# Patient Record
Sex: Female | Born: 1970
Health system: Southern US, Community
[De-identification: ages and names within clinical notes are randomized; demographics above are authoritative.]

## PROBLEM LIST (undated history)

## (undated) DIAGNOSIS — I10 Essential (primary) hypertension: Secondary | ICD-10-CM

## (undated) DIAGNOSIS — M1990 Unspecified osteoarthritis, unspecified site: Secondary | ICD-10-CM

## (undated) DIAGNOSIS — Z8669 Personal history of other diseases of the nervous system and sense organs: Secondary | ICD-10-CM

## (undated) DIAGNOSIS — Z8041 Family history of malignant neoplasm of ovary: Secondary | ICD-10-CM

## (undated) HISTORY — PX: TUBAL LIGATION: SHX77

## (undated) HISTORY — PX: ENDOMETRIAL ABLATION: SHX621

## (undated) HISTORY — PX: BREAST BIOPSY: SHX20

## (undated) HISTORY — PX: FOOT SURGERY: SHX648

## (undated) HISTORY — DX: Personal history of other diseases of the nervous system and sense organs: Z86.69

## (undated) HISTORY — PX: LAPAROSCOPY: SHX197

---

## 2003-05-23 ENCOUNTER — Emergency Department (HOSPITAL_COMMUNITY): Admission: EM | Admit: 2003-05-23 | Discharge: 2003-05-23 | Payer: Self-pay | Admitting: Emergency Medicine

## 2004-06-01 ENCOUNTER — Emergency Department (HOSPITAL_COMMUNITY): Admission: EM | Admit: 2004-06-01 | Discharge: 2004-06-01 | Payer: Self-pay | Admitting: Emergency Medicine

## 2004-09-12 ENCOUNTER — Emergency Department (HOSPITAL_COMMUNITY): Admission: EM | Admit: 2004-09-12 | Discharge: 2004-09-12 | Payer: Self-pay | Admitting: Emergency Medicine

## 2005-01-02 ENCOUNTER — Other Ambulatory Visit: Admission: RE | Admit: 2005-01-02 | Discharge: 2005-01-02 | Payer: Self-pay | Admitting: Obstetrics and Gynecology

## 2006-09-21 ENCOUNTER — Ambulatory Visit: Payer: Self-pay | Admitting: Family Medicine

## 2007-11-18 HISTORY — PX: ENDOMETRIAL ABLATION: SHX621

## 2007-11-28 ENCOUNTER — Ambulatory Visit (HOSPITAL_COMMUNITY): Admission: RE | Admit: 2007-11-28 | Discharge: 2007-11-28 | Payer: Self-pay | Admitting: Obstetrics and Gynecology

## 2007-11-28 ENCOUNTER — Encounter (INDEPENDENT_AMBULATORY_CARE_PROVIDER_SITE_OTHER): Payer: Self-pay | Admitting: Obstetrics and Gynecology

## 2008-03-14 ENCOUNTER — Emergency Department (HOSPITAL_COMMUNITY): Admission: EM | Admit: 2008-03-14 | Discharge: 2008-03-14 | Payer: Self-pay | Admitting: Emergency Medicine

## 2008-04-23 ENCOUNTER — Ambulatory Visit: Payer: Self-pay | Admitting: Family Medicine

## 2008-09-16 ENCOUNTER — Emergency Department (HOSPITAL_COMMUNITY): Admission: EM | Admit: 2008-09-16 | Discharge: 2008-09-16 | Payer: Self-pay | Admitting: Emergency Medicine

## 2008-10-09 ENCOUNTER — Ambulatory Visit: Payer: Self-pay | Admitting: Family Medicine

## 2008-10-29 ENCOUNTER — Ambulatory Visit: Payer: Self-pay | Admitting: Family Medicine

## 2008-12-04 ENCOUNTER — Ambulatory Visit: Payer: Self-pay | Admitting: Family Medicine

## 2008-12-23 ENCOUNTER — Emergency Department (HOSPITAL_COMMUNITY): Admission: EM | Admit: 2008-12-23 | Discharge: 2008-12-23 | Payer: Self-pay | Admitting: Emergency Medicine

## 2009-06-23 ENCOUNTER — Ambulatory Visit: Payer: Self-pay | Admitting: Family Medicine

## 2010-07-09 ENCOUNTER — Encounter: Payer: Self-pay | Admitting: Emergency Medicine

## 2010-09-25 LAB — CBC
HCT: 40.7 % (ref 36.0–46.0)
Hemoglobin: 13.8 g/dL (ref 12.0–15.0)
MCHC: 33.9 g/dL (ref 30.0–36.0)
MCV: 97.8 fL (ref 78.0–100.0)
Platelets: 286 10*3/uL (ref 150–400)
RBC: 4.16 MIL/uL (ref 3.87–5.11)
RDW: 13.2 % (ref 11.5–15.5)
WBC: 7.3 10*3/uL (ref 4.0–10.5)

## 2010-09-25 LAB — BASIC METABOLIC PANEL
BUN: 8 mg/dL (ref 6–23)
CO2: 28 mEq/L (ref 19–32)
Calcium: 9 mg/dL (ref 8.4–10.5)
Chloride: 109 mEq/L (ref 96–112)
Creatinine, Ser: 0.67 mg/dL (ref 0.4–1.2)
GFR calc Af Amer: >60 mL/min (ref >60)
GFR calc non Af Amer: >60 mL/min (ref >60)
Glucose, Bld: 66 mg/dL — ABNORMAL LOW (ref 70–99)
Potassium: 3.5 mEq/L (ref 3.5–5.1)
Sodium: 141 mEq/L (ref 135–145)

## 2010-09-25 LAB — URINALYSIS, ROUTINE W REFLEX MICROSCOPIC
Bilirubin Urine: NEGATIVE
Glucose, UA: NEGATIVE mg/dL
Hgb urine dipstick: NEGATIVE
Ketones, ur: NEGATIVE mg/dL
Nitrite: NEGATIVE
Protein, ur: NEGATIVE mg/dL
Specific Gravity, Urine: 1.023 (ref 1.005–1.030)
Urobilinogen, UA: 1 mg/dL (ref 0.0–1.0)
pH: 6.5 (ref 5.0–8.0)

## 2010-09-25 LAB — DIFFERENTIAL
Basophils Absolute: 0 10*3/uL (ref 0.0–0.1)
Basophils Relative: 1 % (ref 0–1)
Eosinophils Absolute: 0.1 10*3/uL (ref 0.0–0.7)
Eosinophils Relative: 1 % (ref 0–5)
Lymphocytes Relative: 36 % (ref 12–46)
Lymphs Abs: 2.6 10*3/uL (ref 0.7–4.0)
Monocytes Absolute: 0.4 10*3/uL (ref 0.1–1.0)
Monocytes Relative: 6 % (ref 3–12)
Neutro Abs: 4.1 10*3/uL (ref 1.7–7.7)
Neutrophils Relative %: 57 % (ref 43–77)

## 2010-09-25 LAB — POCT PREGNANCY, URINE: Preg Test, Ur: NEGATIVE

## 2010-09-29 LAB — BASIC METABOLIC PANEL
BUN: 7 mg/dL (ref 6–23)
CO2: 24 mEq/L (ref 19–32)
Calcium: 8.7 mg/dL (ref 8.4–10.5)
Chloride: 101 mEq/L (ref 96–112)
Creatinine, Ser: 0.75 mg/dL (ref 0.4–1.2)
GFR calc Af Amer: >60 mL/min (ref >60)
GFR calc non Af Amer: >60 mL/min (ref >60)
Glucose, Bld: 86 mg/dL (ref 70–99)
Potassium: 4.6 mEq/L (ref 3.5–5.1)
Sodium: 132 mEq/L — ABNORMAL LOW (ref 135–145)

## 2010-11-01 NOTE — H&P (Signed)
NAMESAORY, CARRIERO         ACCOUNT NO.:  [id number]   MEDICAL RECORD NO.:  [id number]          PATIENT TYPE:  AMB   LOCATION:  SDC                           FACILITY:  [facility]   PHYSICIAN:  Zenaida Niece, M.D.DATE OF BIRTH:  04-Jun-1971   DATE OF ADMISSION:  DATE OF DISCHARGE:                              HISTORY & PHYSICAL   CHIEF COMPLAINT:  Pelvic pain and irregular menses.   HISTORY OF PRESENT ILLNESS:  This is a 40 year old female para 4-0-2-4,  who saw our nurse practitioner in April for her annual exam.  At that  time, she was complaining of pelvic pain and dysmenorrhea as well as  dyspareunia and recent irregular periods.  She had a pelvic ultrasound  performed in the middle of May which revealed a couple of small fibroids  and normal ovaries although the left ovary is close to the uterus and  this is consistent with a prior ultrasound also with that ovary close to  the uterus.  The patient says her pain is with and between periods and  with intercourse and is gradually getting worse and she wishes to  proceed with laparoscopic evaluation.  Also due to her recent irregular  periods, she wants to proceed with hysteroscopy and endometrial ablation  and is admitted for this at this time.   PAST OBSTETRICAL HISTORY:  Four vaginal deliveries at term and two  elective terminations.   PAST MEDICAL HISTORY:  Negative.   PAST SURGICAL HISTORY:  1. Laparoscopy.  2. Tubal ligation.   ALLERGIES:  None.   CURRENT MEDICATIONS:  None.   SOCIAL HISTORY:  She is married and does smoke six cigarettes a day.   FAMILY HISTORY:  No GYN or colon cancer.   REVIEW OF SYSTEMS:  Normal bowel and bladder function.   PAST GYNECOLOGICAL HISTORY:  No history of sexually transmitted diseases  or abnormal Pap smears.   PHYSICAL EXAMINATION:  GENERAL APPEARANCE:  This is a well-developed  female in no acute distress.  Weight is 144 pounds.  NECK:  Supple without lymphadenopathy or  thyromegaly.  LUNGS:  Clear to auscultation.  HEART:  Regular rate and rhythm without murmur.  ABDOMEN:  Soft, nontender, nondistended without palpable masses.  She  does have scars from prior laparoscopy.  PELVIC:  Exam reveals normal external genitalia.  Speculum reveals a  normal cervix and on bimanual exam she has a small mid planar to  retroverted uterus that is slightly tender.  She has no adnexal masses  and is nontender in the adnexa.  EXTREMITIES:  No edema and are nontender.   ASSESSMENT:  Pelvic pain with dysmenorrhea and dyspareunia.  She also  has recent irregular menses.  All nonsurgical and surgical options have  been discussed with the patient.  The patient wishes to proceed with  laparoscopic evaluation for her pain and wishes to proceed with  hysteroscopy and endometrial ablation for her irregular periods.  All  risks of surgery have been discussed and the patient understands.   PLAN:  Admit the patient on the day of surgery for diagnostic  laparoscopy with possible fulguration of endometriosis or adhesiolysis.  We will then do a hysteroscopy and NovaSure endometrial ablation.      Zenaida Niece, M.D.  Electronically Signed     TDM/MEDQ  D:  11/27/2007  T:  11/27/2007  Job:  [id number]

## 2010-11-01 NOTE — Op Note (Signed)
NAMEKENYON, Emily Baker         ACCOUNT NO.:  [id number]   MEDICAL RECORD NO.:  [id number]          PATIENT TYPE:  AMB   LOCATION:  SDC                           FACILITY:  [facility]   PHYSICIAN:  Zenaida Niece, M.D.DATE OF BIRTH:  24-Aug-1970   DATE OF PROCEDURE:  11/28/2007  DATE OF DISCHARGE:                               OPERATIVE REPORT   PREOPERATIVE DIAGNOSES:  1. Pelvic pain.  2. Dyspareunia.  3. Abnormal uterine bleeding.   POSTOPERATIVE DIAGNOSES:  1. Pelvic pain.  2. Dyspareunia.  3. Abnormal uterine bleeding.  4. Possible endometriosis.  5. Upper abdominal adhesions.   PROCEDURES:  1. Laparoscopy with fulguration of possible endometriosis.  2. Hysteroscopy with dilation and curettage.  3. NovaSure endometrial ablation.   SURGEON:  Zenaida Niece, MD   ANESTHESIA:  General endotracheal tube and paracervical block.   FINDINGS:  Through the laparoscope, she had a normal uterus with a  probable small posterior myoma.  There was a clip on the left fallopian  tube and the right fallopian tube was separated, but no clip was  identified.  She had a couple spots of possible endometriosis around the  right fallopian tube and at the right uterosacral ligament very close to  the right uterine artery.  There were adhesions of the ascending colon  to the right abdominal wall and adhesions of the liver to the abdominal  wall.  With hysteroscopy, she had a normal uterine cavity.  NovaSure  device used a uterine depth of 4.5 cm, uterine width of 4.1 cm, and used  101 watts for approximately 90 seconds.   SPECIMENS:  Endometrial curettings sent to Pathology.   ESTIMATED BLOOD LOSS:  Minimal.   COMPLICATIONS:  None.   PROCEDURE IN DETAIL:  The patient was taken to the operating room and  placed in the dorsal supine position.  General anesthesia was induced,  and legs were placed in mobile stirrups.  Abdomen, perineum and vagina  were then prepped and draped in the usual  sterile fashion.  The bladder  was drained with a latex-free catheter, and a Hulka tenaculum was  applied to the cervix for uterine manipulation.  Infraumbilical skin was  then infiltrated with 0.25% Marcaine and a 0.75-cm horizontal incision  was made in her previous scar.  The Veress needle was inserted into the  peritoneal cavity and placement confirmed by the water drop test and an  opening pressure of 4 mmHg.  CO2 gas was then insufflated to a pressure  of 12 mmHg, and the Veress needle was removed.  A 5-mm laparoscope was  then introduced with direct visualization with a blunt 5-mm trocar.  This achieved good insertion.  A 5-mm port was then placed also on the  left side under direct visualization.  Inspection revealed the above-  mentioned findings.  Adhesions of the ascending colon to the abdominal  wall were taken down bluntly.  The adhesions of the liver on both sides  to the anterior abdominal wall were left alone.  Inspection of pelvis  revealed essentially normal anatomy.  Again, the right tube did not have  a clip on  it, but did appear to be separated by a good 1-2 cm.  There  were a couple of spots around the right fallopian tube that appeared to  be consistent with endometriosis, and these were fulgurated with bipolar  cautery.  There was another spot very close to the right uterine artery  and this was left alone.  No other source for her pelvic pain was  identified.  The 5-mm trocar on the left side was removed under direct  visualization.  All gas was allowed to deflate from the abdomen.  The  umbilical trocar was then removed.  Skin incisions were closed with  interrupted subcuticular sutures of 4-0 Vicryl followed by Dermabond.   Attention was turned to hysteroscopy.  The legs were elevated in  stirrups.  A Graves speculum was inserted into the vagina and the  anterior lip of the cervix was grasped with a single-tooth tenaculum.  The patient was given Toradol IV.   Paracervical block was performed with  a total of 16 mL of 2% plain lidocaine.  This was a deep block to help  aid with the endometrial ablation.  Uterus then sounded to 8 cm.  Cervix  was gradually, easily dilated eventually to accommodate a size 7 Hegar  dilator.  This measured the cervix at 3.5 cm.  The observer hysteroscope  was inserted and good visualization was achieved.  The uterine cavity  appeared normal with a fair amount of endometrial tissue.  The observer  hysteroscope was removed.  Sharp curettage was then performed with  return of minimal tissue.  Hysteroscopy was again performed and revealed  again a normal cavity with less endometrial tissue.  The hysteroscope  was then removed.  The cervix was further dilated to accommodate the  size 8 Hegar dilator.  The NovaSure device was then inserted and  deployed appropriately.  CO2 test was passed, and endometrial ablation  was performed with the above-mentioned settings without complications.  The device was then removed intact.  Hysteroscopy again was performed  and revealed a normal cavity, which appeared to have good ablation.  Hysteroscope was removed.  The single-tooth tenaculum was removed and  all bleeding was controlled with pressure.  All instruments were then  removed from the vagina.  The patient was then taken down from stirrups.  She was extubated in the operating room and taken to the recovery room  in stable condition after tolerating the procedure well.  Counts were  correct x2 and she had PAS hose on throughout the procedure.      Zenaida Niece, M.D.  Electronically Signed     TDM/MEDQ  D:  11/28/2007  T:  11/28/2007  Job:  [id number]

## 2011-01-17 ENCOUNTER — Ambulatory Visit (INDEPENDENT_AMBULATORY_CARE_PROVIDER_SITE_OTHER): Payer: BC Managed Care – PPO | Admitting: Family Medicine

## 2011-01-17 ENCOUNTER — Encounter: Payer: Self-pay | Admitting: Family Medicine

## 2011-01-17 VITALS — BP 146/90 | HR 84 | Wt 137.0 lb

## 2011-01-17 DIAGNOSIS — R519 Headache, unspecified: Secondary | ICD-10-CM

## 2011-01-17 DIAGNOSIS — R51 Headache: Secondary | ICD-10-CM

## 2011-01-17 NOTE — Progress Notes (Signed)
  Subjective:    Patient ID: Emily Baker, female    DOB: Oct 28, 1970, 40 y.o.   MRN: [id number]  HPI She is here for evaluation of headaches. She has a history of migraine headaches however she also mentions having chronic daily headaches at least for the last year. She placed on Effexor in 2010 and stopped but does not rule it helped the headaches. For her daily headaches she takes 2 Advil 5 or 6 times per day. The headache is constant and pressure-like. No sneezing, itchy watery eyes, blurred vision, double vision, nausea. She is on no medicine. She does smoke   Review of Systems     Objective:   Physical Exam alert and in no distress. EOMI. Tympanic membranes and canals are normal. Throat is clear. Tonsils are normal. Neck is supple without adenopathy or thyromegaly. Cardiac exam shows a regular sinus rhythm without murmurs or gallops. Lungs are clear to auscultation.        Assessment & Plan:  Chronic daily headaches for medication overuse Refer to headache clinic.

## 2011-03-16 LAB — CBC
HCT: 41.3
Hemoglobin: 14.2
MCHC: 34.4
MCV: 96.2
Platelets: 173
RBC: 4.29
RDW: 12.7
WBC: 5.9

## 2011-03-16 LAB — PREGNANCY, URINE: Preg Test, Ur: NEGATIVE

## 2012-01-17 ENCOUNTER — Emergency Department (HOSPITAL_BASED_OUTPATIENT_CLINIC_OR_DEPARTMENT_OTHER)
Admission: EM | Admit: 2012-01-17 | Discharge: 2012-01-17 | Disposition: A | Discharge status: Home / Self Care | Payer: Self-pay | Attending: Emergency Medicine | Admitting: Emergency Medicine

## 2012-01-17 ENCOUNTER — Encounter (HOSPITAL_BASED_OUTPATIENT_CLINIC_OR_DEPARTMENT_OTHER): Payer: Self-pay

## 2012-01-17 DIAGNOSIS — F17200 Nicotine dependence, unspecified, uncomplicated: Secondary | ICD-10-CM | POA: Insufficient documentation

## 2012-01-17 DIAGNOSIS — H109 Unspecified conjunctivitis: Secondary | ICD-10-CM | POA: Insufficient documentation

## 2012-01-17 MED ORDER — TOBRAMYCIN 0.3 % OP SOLN: 1.0000 [drp] | OPHTHALMIC | Status: DC

## 2012-01-17 NOTE — ED Notes (Signed)
Redness, drainage and pain to left eye that started Monday.  Recent exposure to pink eye.

## 2012-01-17 NOTE — ED Provider Notes (Signed)
History     CSN: [id number]  Arrival date & time 01/17/12  1118   First MD Initiated Contact with Patient 01/17/12 1158      Chief Complaint  Patient presents with  . Conjunctivitis    (Consider location/radiation/quality/duration/timing/severity/associated sxs/prior treatment) Patient is a 41 y.o. female presenting with conjunctivitis. The history is provided by the patient. No language interpreter was used.  Conjunctivitis  The current episode started today. The problem occurs occasionally. The problem has been gradually worsening. The problem is moderate. Nothing relieves the symptoms. Nothing aggravates the symptoms. Associated symptoms include photophobia and eye redness. The eye pain is moderate. There is pain in the left eye. The eye pain is not associated with movement. There were sick contacts at home.  Pt reports exposure to child with pink eye  History reviewed. No pertinent past medical history.  Past Surgical History  Procedure Date  . Endometrial ablation   . Tubal ligation     No family history on file.  History  Substance Use Topics  . Smoking status: Current Everyday Smoker -- 0.5 packs/day  . Smokeless tobacco: Never Used  . Alcohol Use: Yes     occasional    OB History    Grav Para Term Preterm Abortions TAB SAB Ect Mult Living                  Review of Systems  Eyes: Positive for photophobia and redness.  All other systems reviewed and are negative.    Allergies  Review of patient's allergies indicates no known allergies.  Home Medications  No current outpatient prescriptions on file.  BP 140/93  Pulse 84  Temp 98 F (36.7 C) (Oral)  Resp 20  Ht 5\' 3"  (1.6 m)  Wt 137 lb (62.143 kg)  BMI 24.27 kg/m2  SpO2 100%  Physical Exam  Nursing note and vitals reviewed. Constitutional: She is oriented to person, place, and time. She appears well-developed and well-nourished.  HENT:  Head: Normocephalic and atraumatic.  Right Ear:  External ear normal.  Left Ear: External ear normal.  Nose: Nose normal.  Mouth/Throat: Oropharynx is clear and moist.  Eyes: Right eye exhibits discharge.  Neck: Normal range of motion. Neck supple.  Pulmonary/Chest: Effort normal and breath sounds normal.  Abdominal: Soft.  Musculoskeletal: Normal range of motion.  Neurological: She is alert and oriented to person, place, and time. She has normal reflexes.  Skin: Skin is warm.  Psychiatric: She has a normal mood and affect.    ED Course  Procedures (including critical care time)  Labs Reviewed - No data to display No results found.   No diagnosis found.    MDM  tobrex Baldemar Lenis, [place] 01/17/12 1208

## 2012-01-17 NOTE — ED Notes (Signed)
Visual acuity completed.  Patient able to ambulate with standby assistance to the Eye chart; gait steady.

## 2012-01-17 NOTE — ED Provider Notes (Signed)
Medical screening examination/treatment/procedure(s) were performed by non-physician practitioner and as supervising physician I was immediately available for consultation/collaboration.   Luisantonio Adinolfi, MD 01/17/12 1538 

## 2012-01-21 ENCOUNTER — Encounter (HOSPITAL_BASED_OUTPATIENT_CLINIC_OR_DEPARTMENT_OTHER): Payer: Self-pay | Admitting: Emergency Medicine

## 2012-01-21 ENCOUNTER — Emergency Department (HOSPITAL_BASED_OUTPATIENT_CLINIC_OR_DEPARTMENT_OTHER)
Admission: EM | Admit: 2012-01-21 | Discharge: 2012-01-21 | Disposition: A | Discharge status: Home / Self Care | Payer: Self-pay | Attending: Emergency Medicine | Admitting: Emergency Medicine

## 2012-01-21 DIAGNOSIS — H109 Unspecified conjunctivitis: Secondary | ICD-10-CM | POA: Insufficient documentation

## 2012-01-21 DIAGNOSIS — F17200 Nicotine dependence, unspecified, uncomplicated: Secondary | ICD-10-CM | POA: Insufficient documentation

## 2012-01-21 MED ORDER — SULFACETAMIDE SODIUM 10 % OP SOLN: 1.0000 [drp] | OPHTHALMIC | Status: DC

## 2012-01-21 NOTE — ED Notes (Signed)
Pt stated that she started having left eye pain on Sunday 7/28. The next day it turned red and pt came to [facility] center and was diagnosed with conjunctivitis. She stated that it still hurts but the pain is not as bad. She stated that its not watering like it was and the eye cont to be red.

## 2012-01-21 NOTE — ED Provider Notes (Signed)
History     CSN: [id number]  Arrival date & time 01/21/12  1035   First MD Initiated Contact with Patient 01/21/12 1103      Chief Complaint  Patient presents with  . Eye Pain  . Conjunctivitis    (Consider location/radiation/quality/duration/timing/severity/associated sxs/prior treatment) HPI Comments: Was seen here for conjunctivitis one week ago and given drops and has not improved.  Patient is a 41 y.o. female presenting with eye pain. The history is provided by the patient.  Eye Pain This is a new problem. Episode onset: one week ago. The problem occurs constantly. The problem has not changed since onset.Nothing aggravates the symptoms. Nothing relieves the symptoms.    History reviewed. No pertinent past medical history.  Past Surgical History  Procedure Date  . Endometrial ablation   . Tubal ligation     No family history on file.  History  Substance Use Topics  . Smoking status: Current Everyday Smoker -- 0.5 packs/day  . Smokeless tobacco: Never Used  . Alcohol Use: Yes     occasional    OB History    Grav Para Term Preterm Abortions TAB SAB Ect Mult Living                  Review of Systems  All other systems reviewed and are negative.    Allergies  Review of patient's allergies indicates no known allergies.  Home Medications   Current Outpatient Rx  Name Route Sig Dispense Refill  . TOBRAMYCIN SULFATE 0.3 % OP SOLN Left Eye Place 1 drop into the left eye every 4 (four) hours. 5 mL 0    BP 131/78  Pulse 63  Temp 98.7 F (37.1 C) (Oral)  Resp 18  SpO2 100%  Physical Exam  Nursing note and vitals reviewed. Constitutional: She is oriented to person, place, and time. She appears well-developed and well-nourished. No distress.  HENT:  Head: Normocephalic and atraumatic.  Eyes:       The left conjunctiva is injected with clear drainage.  There are no corneal abrasions or foreign bodies noted.    Neck: Normal range of motion. Neck supple.    Neurological: She is alert and oriented to person, place, and time.  Skin: Skin is warm and dry. She is not diaphoretic.    ED Course  Procedures (including critical care time)  Labs Reviewed - No data to display No results found.   No diagnosis found.    MDM  Looks like conjunctivitis that is not responding to tobramycin.  Will change from tobramycin to bleph 10.  Return prn if not improving.          Geoffery Lyons, MD 01/21/12 [phone]

## 2012-01-24 ENCOUNTER — Emergency Department (HOSPITAL_BASED_OUTPATIENT_CLINIC_OR_DEPARTMENT_OTHER)
Admission: EM | Admit: 2012-01-24 | Discharge: 2012-01-24 | Disposition: A | Discharge status: Home / Self Care | Payer: BC Managed Care – PPO | Attending: Emergency Medicine | Admitting: Emergency Medicine

## 2012-01-24 ENCOUNTER — Encounter (HOSPITAL_BASED_OUTPATIENT_CLINIC_OR_DEPARTMENT_OTHER): Payer: Self-pay | Admitting: Emergency Medicine

## 2012-01-24 DIAGNOSIS — H5710 Ocular pain, unspecified eye: Secondary | ICD-10-CM | POA: Insufficient documentation

## 2012-01-24 DIAGNOSIS — H5789 Other specified disorders of eye and adnexa: Secondary | ICD-10-CM | POA: Insufficient documentation

## 2012-01-24 DIAGNOSIS — F17200 Nicotine dependence, unspecified, uncomplicated: Secondary | ICD-10-CM | POA: Insufficient documentation

## 2012-01-24 DIAGNOSIS — H5712 Ocular pain, left eye: Secondary | ICD-10-CM

## 2012-01-24 MED ORDER — FLUORESCEIN SODIUM 1 MG OP STRP
ORAL_STRIP | OPHTHALMIC | Status: AC
  Administered 2012-01-24: 22:00:00
  Filled 2012-01-24: qty 1

## 2012-01-24 MED ORDER — TRAMADOL HCL 50 MG PO TABS: 50.0000 mg | ORAL_TABLET | Freq: Four times a day (QID) | ORAL | Status: AC | PRN

## 2012-01-24 MED ORDER — TETRACAINE HCL 0.5 % OP SOLN
OPHTHALMIC | Status: AC
  Administered 2012-01-24: 1 [drp]
  Filled 2012-01-24: qty 2

## 2012-01-24 MED ORDER — IBUPROFEN 800 MG PO TABS
800.0000 mg | ORAL_TABLET | Freq: Once | ORAL | Status: AC
  Administered 2012-01-24: 800 mg via ORAL
  Filled 2012-01-24: qty 1

## 2012-01-24 MED ORDER — OLOPATADINE HCL 0.1 % OP SOLN
1.0000 [drp] | Freq: Two times a day (BID) | OPHTHALMIC | Status: DC
  Filled 2012-01-24: qty 5

## 2012-01-24 MED ORDER — OLOPATADINE HCL 0.1 % OP SOLN: 1.0000 [drp] | Freq: Two times a day (BID) | OPHTHALMIC | Status: AC

## 2012-01-24 NOTE — ED Notes (Signed)
Pt having redness and drainage to left eye x 1 week. Pt was seen here last wed and sun for same with no improvement.

## 2012-01-24 NOTE — ED Provider Notes (Signed)
History     CSN: [id number]  Arrival date & time 01/24/12  1932   First MD Initiated Contact with Patient 01/24/12 2105      Chief Complaint  Patient presents with  . Conjunctivitis    (Consider location/radiation/quality/duration/timing/severity/associated sxs/prior treatment) HPI  41 year old female in no acute distress complaining of persistence of left eye redness and pain over the course of the last 7 days. Patient has been seen twice for similar in the last 7 days. She was given Tobramycin x7  Days ago, switched to erythromycin ointment x2 days and switched to sulfacetamide  yesterday.  Steady, non-worsening eye pain and vision blurred. Ibuprofen relieves pain. Pain is 8/10 throbbing, with associated headache. Pian is exacerbated by eye movement,  Pt is photosensitive. Denies purulent discharge.  History reviewed. No pertinent past medical history.  Past Surgical History  Procedure Date  . Endometrial ablation   . Tubal ligation     No family history on file.  History  Substance Use Topics  . Smoking status: Current Everyday Smoker -- 0.5 packs/day  . Smokeless tobacco: Never Used  . Alcohol Use: Yes     occasional    OB History    Grav Para Term Preterm Abortions TAB SAB Ect Mult Living                  Review of Systems  Eyes: Positive for photophobia, pain and redness.  All other systems reviewed and are negative.    Allergies  Review of patient's allergies indicates no known allergies.  Home Medications   Current Outpatient Rx  Name Route Sig Dispense Refill  . SULFACETAMIDE SODIUM 10 % OP SOLN Left Eye Place 1-2 drops into the left eye every 4 (four) hours. 5 mL 0  . TOBRAMYCIN SULFATE 0.3 % OP SOLN Left Eye Place 1 drop into the left eye every 4 (four) hours. 5 mL 0    BP 138/94  Pulse 86  Temp 98.7 F (37.1 C) (Oral)  Resp 18  SpO2 100%  Physical Exam  Vitals reviewed. Constitutional: She is oriented to person, place, and time. She  appears well-developed and well-nourished. No distress.  HENT:  Head: Normocephalic.  Eyes: Conjunctivae and EOM are normal. Pupils are equal, round, and reactive to light.       Left eye has severely injected palpebral and bulbar conjunctiva. Pupils are equal and reactive to light bilaterally. The left eye is watering with no purulent discharge. There is no swelling to the eyelids and no proptosis.  Cardiovascular: Normal rate.   Pulmonary/Chest: Effort normal.  Musculoskeletal: Normal range of motion.  Neurological: She is alert and oriented to person, place, and time.  Psychiatric: She has a normal mood and affect.    ED Course  Procedures (including critical care time)  Labs Reviewed - No data to display No results found.   1. Redness of eye, left   2. Pain, eye, left       MDM  41 year old female with injected painful left eye for 7 days. Patient has been using antibiotic eyedrops for the last 7 days with no improvement in condition.  Fluorescein stain reveals no corneal abrasion. Intraocular pressures are normal as measured by the tonometer at 18 mmHg.   We'll give the patient no drops and tramadol for pain control and advised her to follow within the next 24 hours with ophthalmologist Dr. Allyne Gee.  Discussed case with attending who agrees with plan and stability to d/c to home.  Pt verbalized understanding and agrees with care plan. Outpatient follow-up and return precautions given.            Wynetta Emery, PA-C 01/24/12 2222

## 2012-01-25 NOTE — ED Provider Notes (Signed)
Medical screening examination/treatment/procedure(s) were performed by non-physician practitioner and as supervising physician I was immediately available for consultation/collaboration.    Kevina Piloto D Ramzey Petrovic, MD 01/25/12 0309 

## 2012-11-17 LAB — HM PAP SMEAR: HM Pap smear: NORMAL

## 2012-11-17 LAB — HM MAMMOGRAPHY: HM Mammogram: NORMAL

## 2013-08-21 ENCOUNTER — Emergency Department (HOSPITAL_BASED_OUTPATIENT_CLINIC_OR_DEPARTMENT_OTHER)
Admission: EM | Admit: 2013-08-21 | Discharge: 2013-08-21 | Disposition: A | Discharge status: Home / Self Care | Payer: No Typology Code available for payment source | Attending: Emergency Medicine | Admitting: Emergency Medicine

## 2013-08-21 ENCOUNTER — Encounter (HOSPITAL_BASED_OUTPATIENT_CLINIC_OR_DEPARTMENT_OTHER): Payer: Self-pay | Admitting: Emergency Medicine

## 2013-08-21 DIAGNOSIS — R519 Headache, unspecified: Secondary | ICD-10-CM

## 2013-08-21 DIAGNOSIS — R079 Chest pain, unspecified: Secondary | ICD-10-CM

## 2013-08-21 DIAGNOSIS — E876 Hypokalemia: Secondary | ICD-10-CM | POA: Insufficient documentation

## 2013-08-21 DIAGNOSIS — R0789 Other chest pain: Secondary | ICD-10-CM | POA: Insufficient documentation

## 2013-08-21 DIAGNOSIS — R51 Headache: Secondary | ICD-10-CM | POA: Insufficient documentation

## 2013-08-21 DIAGNOSIS — F17200 Nicotine dependence, unspecified, uncomplicated: Secondary | ICD-10-CM | POA: Insufficient documentation

## 2013-08-21 LAB — BASIC METABOLIC PANEL
BUN: 6 mg/dL (ref 6–23)
CO2: 21 mEq/L (ref 19–32)
Calcium: 7.5 mg/dL — ABNORMAL LOW (ref 8.4–10.5)
Chloride: 108 mEq/L (ref 96–112)
Creatinine, Ser: 0.6 mg/dL (ref 0.50–1.10)
GFR calc Af Amer: >90 mL/min (ref >90)
GFR calc non Af Amer: >90 mL/min (ref >90)
Glucose, Bld: 88 mg/dL (ref 70–99)
Potassium: 2.7 mEq/L — CL (ref 3.7–5.3)
Sodium: 142 mEq/L (ref 137–147)

## 2013-08-21 LAB — CBC
HCT: 38.4 % (ref 36.0–46.0)
Hemoglobin: 13.4 g/dL (ref 12.0–15.0)
MCH: 32.2 pg (ref 26.0–34.0)
MCHC: 34.9 g/dL (ref 30.0–36.0)
MCV: 92.3 fL (ref 78.0–100.0)
Platelets: 244 10*3/uL (ref 150–400)
RBC: 4.16 MIL/uL (ref 3.87–5.11)
RDW: 12.2 % (ref 11.5–15.5)
WBC: 7.6 10*3/uL (ref 4.0–10.5)

## 2013-08-21 LAB — TROPONIN I: Troponin I: <0.3 ng/mL (ref <0.30)

## 2013-08-21 MED ORDER — METOCLOPRAMIDE HCL 5 MG/ML IJ SOLN
10.0000 mg | Freq: Once | INTRAMUSCULAR | Status: AC
  Administered 2013-08-21: 10 mg via INTRAVENOUS
  Filled 2013-08-21: qty 2

## 2013-08-21 MED ORDER — POTASSIUM CHLORIDE CRYS ER 20 MEQ PO TBCR
60.0000 meq | EXTENDED_RELEASE_TABLET | Freq: Once | ORAL | Status: AC
  Administered 2013-08-21: 60 meq via ORAL
  Filled 2013-08-21: qty 3

## 2013-08-21 MED ORDER — KETOROLAC TROMETHAMINE 30 MG/ML IJ SOLN
30.0000 mg | Freq: Once | INTRAMUSCULAR | Status: AC
  Administered 2013-08-21: 30 mg via INTRAVENOUS
  Filled 2013-08-21: qty 2

## 2013-08-21 MED ORDER — DIPHENHYDRAMINE HCL 50 MG/ML IJ SOLN
25.0000 mg | Freq: Once | INTRAMUSCULAR | Status: AC
  Administered 2013-08-21: 25 mg via INTRAVENOUS
  Filled 2013-08-21: qty 1

## 2013-08-21 MED ORDER — DEXAMETHASONE SODIUM PHOSPHATE 10 MG/ML IJ SOLN
10.0000 mg | Freq: Once | INTRAMUSCULAR | Status: AC
  Administered 2013-08-21: 10 mg via INTRAVENOUS
  Filled 2013-08-21: qty 1

## 2013-08-21 MED ORDER — SODIUM CHLORIDE 0.9 % IV BOLUS (SEPSIS)
1000.0000 mL | Freq: Once | INTRAVENOUS | Status: AC
  Administered 2013-08-21: 1000 mL via INTRAVENOUS

## 2013-08-21 NOTE — Discharge Instructions (Signed)
As discussed, your evaluation today has been largely reassuring beyond the low potassium level.  But, it is important that you monitor your condition carefully, and do not hesitate to return to the ED if you develop new, or concerning changes in your condition.  Otherwise, please follow-up with your physician for appropriate ongoing care.

## 2013-08-21 NOTE — ED Provider Notes (Signed)
CSN: [id number]     Arrival date & time 08/21/13  1340 History   First MD Initiated Contact with Patient 08/21/13 1344     Chief Complaint  Patient presents with  . Headache  . Chest Pain      HPI  Patient presents with concerns headache, chest pain. Patient has a history of migraine in the distant past, though no headaches recently.  She notes that after a stressful family event this weekend she developed her headache. She gradually developed headache 4 days ago.  Since onset the headache is become more present, more painful.  Headache is right sided, nonradiating, throbbing, sore.  There is continuous pain in the right head and right neck. Pain is improved with aspirin or ibuprofen. Today the patient developed anterior chest pressure, nonradiating, sore, sternal and epigastric.  No associated dyspnea, lightheadedness, syncope, unilateral weakness. As the headache persisted the patient also developed right-sided neck pain, though no visual loss. No nausea, vomiting, diarrhea, disequilibrium, discoordination.   History reviewed. No pertinent past medical history. Past Surgical History  Procedure Laterality Date  . Endometrial ablation    . Tubal ligation    . Foot surgery     No family history on file. History  Substance Use Topics  . Smoking status: Current Every Day Smoker -- 0.50 packs/day  . Smokeless tobacco: Never Used  . Alcohol Use: Yes     Comment: occasional   OB History   Grav Para Term Preterm Abortions TAB SAB Ect Mult Living                 Review of Systems  Constitutional:       Per HPI, otherwise negative  HENT:       Per HPI, otherwise negative  Respiratory:       Per HPI, otherwise negative  Cardiovascular:       Per HPI, otherwise negative  Gastrointestinal: Negative for vomiting.  Endocrine:       Negative aside from HPI  Genitourinary:       Neg aside from HPI   Musculoskeletal:       Per HPI, otherwise negative  Skin: Negative.    Neurological: Positive for headaches. Negative for syncope.      Allergies  Review of patient's allergies indicates no known allergies.  Home Medications   Current Outpatient Rx  Name  Route  Sig  Dispense  Refill  . ibuprofen (ADVIL,MOTRIN) 200 MG tablet   Oral   Take 400 mg by mouth every 6 (six) hours as needed. For pain.         Marland Kitchen PRESCRIPTION MEDICATION   Ophthalmic   Apply 1 application to eye daily. Erythromycin ophthalmic ointment 3.5 %.          BP 162/88  Pulse 88  Temp(Src) 98.9 F (37.2 C) (Oral)  Resp 18  SpO2 100% Physical Exam  Nursing note and vitals reviewed. Constitutional: She is oriented to person, place, and time. She appears well-developed and well-nourished. No distress.  HENT:  Head: Normocephalic and atraumatic.  Eyes: Conjunctivae and EOM are normal.  Neck: Normal range of motion. Neck supple. No rigidity. No edema, no erythema and normal range of motion present.  Cardiovascular: Normal rate and regular rhythm.   Pulmonary/Chest: Effort normal and breath sounds normal. No stridor. No respiratory distress.  Abdominal: She exhibits no distension.  Musculoskeletal: She exhibits no edema.  Neurological: She is alert and oriented to person, place, and time. She displays no  atrophy and no tremor. No cranial nerve deficit or sensory deficit. She exhibits normal muscle tone. She displays no seizure activity. Coordination and gait normal.  Patient ambulatory to bathroom and back with no antalgic gait  Skin: Skin is warm and dry.  Psychiatric: She has a normal mood and affect.    ED Course  Procedures (including critical care time) Labs Review Labs Reviewed  CBC  TROPONIN I  BASIC METABOLIC PANEL   Imaging Review No results found.   EKG Interpretation   Date/Time:  Thursday August 21 2013 13:53:07 EST Ventricular Rate:  87 PR Interval:  142 QRS Duration: 78 QT Interval:  382 QTC Calculation: 459 R Axis:   69 Text Interpretation:   Normal sinus rhythm Normal ECG Sinus rhythm Normal  ECG Confirmed by Carmin Muskrat  MD ([id number]) on 08/21/2013 2:26:33 PM     3:17 PM Patient in no distress.  She appears well.  HA improved. MDM  Patient presents with concern of ongoing headache, no chest pain.  On exam she is awake, alert, hemodynamically stable, neurologically intact, in no distress.  With patient's history of migraines in the past, there is some suspicion for this entity.  Patient improved here with fluids, analgesics.  Patient was hungry.  Patient's chest pain may be due to anti-inflammatory use.  Patient's EKG, labs are reassuring, and she has no risk factors for ACS.    Carmin Muskrat, MD 08/21/13 [phone]

## 2013-08-21 NOTE — ED Notes (Signed)
Pt c/o HA since Monday with sudden onset; Pt also c/o chest tightness that started about 11am today; Pt has been taking Goodies and ibuprofen for headache with no relief

## 2013-11-18 DIAGNOSIS — G43909 Migraine, unspecified, not intractable, without status migrainosus: Secondary | ICD-10-CM | POA: Insufficient documentation

## 2013-11-18 DIAGNOSIS — I10 Essential (primary) hypertension: Secondary | ICD-10-CM | POA: Insufficient documentation

## 2014-05-20 ENCOUNTER — Emergency Department (HOSPITAL_BASED_OUTPATIENT_CLINIC_OR_DEPARTMENT_OTHER)
Admission: EM | Admit: 2014-05-20 | Discharge: 2014-05-20 | Disposition: A | Discharge status: Home / Self Care | Payer: No Typology Code available for payment source | Attending: Emergency Medicine | Admitting: Emergency Medicine

## 2014-05-20 ENCOUNTER — Encounter (HOSPITAL_BASED_OUTPATIENT_CLINIC_OR_DEPARTMENT_OTHER): Payer: Self-pay

## 2014-05-20 DIAGNOSIS — I10 Essential (primary) hypertension: Secondary | ICD-10-CM | POA: Insufficient documentation

## 2014-05-20 DIAGNOSIS — Z79899 Other long term (current) drug therapy: Secondary | ICD-10-CM | POA: Insufficient documentation

## 2014-05-20 DIAGNOSIS — Z720 Tobacco use: Secondary | ICD-10-CM | POA: Insufficient documentation

## 2014-05-20 DIAGNOSIS — R0602 Shortness of breath: Secondary | ICD-10-CM | POA: Insufficient documentation

## 2014-05-20 DIAGNOSIS — Z3202 Encounter for pregnancy test, result negative: Secondary | ICD-10-CM | POA: Insufficient documentation

## 2014-05-20 DIAGNOSIS — I160 Hypertensive urgency: Secondary | ICD-10-CM

## 2014-05-20 HISTORY — DX: Essential (primary) hypertension: I10

## 2014-05-20 LAB — CBC WITH DIFFERENTIAL/PLATELET
Basophils Absolute: 0 10*3/uL (ref 0.0–0.1)
Basophils Relative: 0 % (ref 0–1)
Eosinophils Absolute: 0 10*3/uL (ref 0.0–0.7)
Eosinophils Relative: 0 % (ref 0–5)
HCT: 41.5 % (ref 36.0–46.0)
Hemoglobin: 14.3 g/dL (ref 12.0–15.0)
Lymphocytes Relative: 32 % (ref 12–46)
Lymphs Abs: 2.7 10*3/uL (ref 0.7–4.0)
MCH: 32.2 pg (ref 26.0–34.0)
MCHC: 34.5 g/dL (ref 30.0–36.0)
MCV: 93.5 fL (ref 78.0–100.0)
Monocytes Absolute: 0.7 10*3/uL (ref 0.1–1.0)
Monocytes Relative: 8 % (ref 3–12)
Neutro Abs: 5 10*3/uL (ref 1.7–7.7)
Neutrophils Relative %: 60 % (ref 43–77)
Platelets: 245 10*3/uL (ref 150–400)
RBC: 4.44 MIL/uL (ref 3.87–5.11)
RDW: 13.4 % (ref 11.5–15.5)
WBC: 8.4 10*3/uL (ref 4.0–10.5)

## 2014-05-20 LAB — COMPREHENSIVE METABOLIC PANEL
ALT: 15 U/L (ref 0–35)
AST: 21 U/L (ref 0–37)
Albumin: 4.2 g/dL (ref 3.5–5.2)
Alkaline Phosphatase: 48 U/L (ref 39–117)
Anion gap: 14 (ref 5–15)
BUN: 6 mg/dL (ref 6–23)
CO2: 25 mEq/L (ref 19–32)
Calcium: 9.7 mg/dL (ref 8.4–10.5)
Chloride: 101 mEq/L (ref 96–112)
Creatinine, Ser: 0.6 mg/dL (ref 0.50–1.10)
GFR calc Af Amer: >90 mL/min (ref >90)
GFR calc non Af Amer: >90 mL/min (ref >90)
Glucose, Bld: 82 mg/dL (ref 70–99)
Potassium: 3.8 mEq/L (ref 3.7–5.3)
Sodium: 140 mEq/L (ref 137–147)
Total Bilirubin: 0.5 mg/dL (ref 0.3–1.2)
Total Protein: 7.8 g/dL (ref 6.0–8.3)

## 2014-05-20 LAB — URINALYSIS, ROUTINE W REFLEX MICROSCOPIC
Bilirubin Urine: NEGATIVE
Glucose, UA: NEGATIVE mg/dL
Hgb urine dipstick: NEGATIVE
Ketones, ur: NEGATIVE mg/dL
Leukocytes, UA: NEGATIVE
Nitrite: NEGATIVE
Protein, ur: NEGATIVE mg/dL
Specific Gravity, Urine: 1.013 (ref 1.005–1.030)
Urobilinogen, UA: 0.2 mg/dL (ref 0.0–1.0)
pH: 5.5 (ref 5.0–8.0)

## 2014-05-20 LAB — TROPONIN I: Troponin I: <0.3 ng/mL (ref <0.30)

## 2014-05-20 LAB — PREGNANCY, URINE: Preg Test, Ur: NEGATIVE

## 2014-05-20 MED ORDER — AMLODIPINE BESYLATE 5 MG PO TABS: 5.0000 mg | ORAL_TABLET | Freq: Every day | ORAL | Status: DC

## 2014-05-20 MED ORDER — ACETAMINOPHEN 500 MG PO TABS
1000.0000 mg | ORAL_TABLET | Freq: Once | ORAL | Status: AC
  Administered 2014-05-20: 1000 mg via ORAL
  Filled 2014-05-20: qty 2

## 2014-05-20 NOTE — Discharge Instructions (Signed)
Norvasc as prescribed. Continue your ramipril as prescribed.  Keep a record your blood pressures until your follow-up appointment with your primary doctor. This should be within the next week.   Hypertension Hypertension, commonly called high blood pressure, is when the force of blood pumping through your arteries is too strong. Your arteries are the blood vessels that carry blood from your heart throughout your body. A blood pressure reading consists of a higher number over a lower number, such as 110/72. The higher number (systolic) is the pressure inside your arteries when your heart pumps. The lower number (diastolic) is the pressure inside your arteries when your heart relaxes. Ideally you want your blood pressure below 120/80. Hypertension forces your heart to work harder to pump blood. Your arteries may become narrow or stiff. Having hypertension puts you at risk for heart disease, stroke, and other problems.  RISK FACTORS Some risk factors for high blood pressure are controllable. Others are not.  Risk factors you cannot control include:   Race. You may be at higher risk if you are African American.  Age. Risk increases with age.  Gender. Men are at higher risk than women before age 55 years. After age 109, women are at higher risk than men. Risk factors you can control include:  Not getting enough exercise or physical activity.  Being overweight.  Getting too much fat, sugar, calories, or salt in your diet.  Drinking too much alcohol. SIGNS AND SYMPTOMS Hypertension does not usually cause signs or symptoms. Extremely high blood pressure (hypertensive crisis) may cause headache, anxiety, shortness of breath, and nosebleed. DIAGNOSIS  To check if you have hypertension, your health care provider will measure your blood pressure while you are seated, with your arm held at the level of your heart. It should be measured at least twice using the same arm. Certain conditions can cause a  difference in blood pressure between your right and left arms. A blood pressure reading that is higher than normal on one occasion does not mean that you need treatment. If one blood pressure reading is high, ask your health care provider about having it checked again. TREATMENT  Treating high blood pressure includes making lifestyle changes and possibly taking medicine. Living a healthy lifestyle can help lower high blood pressure. You may need to change some of your habits. Lifestyle changes may include:  Following the DASH diet. This diet is high in fruits, vegetables, and whole grains. It is low in salt, red meat, and added sugars.  Getting at least 2 hours of brisk physical activity every week.  Losing weight if necessary.  Not smoking.  Limiting alcoholic beverages.  Learning ways to reduce stress. If lifestyle changes are not enough to get your blood pressure under control, your health care provider may prescribe medicine. You may need to take more than one. Work closely with your health care provider to understand the risks and benefits. HOME CARE INSTRUCTIONS  Have your blood pressure rechecked as directed by your health care provider.   Take medicines only as directed by your health care provider. Follow the directions carefully. Blood pressure medicines must be taken as prescribed. The medicine does not work as well when you skip doses. Skipping doses also puts you at risk for problems.   Do not smoke.   Monitor your blood pressure at home as directed by your health care provider. SEEK MEDICAL CARE IF:   You think you are having a reaction to medicines taken.  You have  recurrent headaches or feel dizzy.  You have swelling in your ankles.  You have trouble with your vision. SEEK IMMEDIATE MEDICAL CARE IF:  You develop a severe headache or confusion.  You have unusual weakness, numbness, or feel faint.  You have severe chest or abdominal pain.  You vomit  repeatedly.  You have trouble breathing. MAKE SURE YOU:   Understand these instructions.  Will watch your condition.  Will get help right away if you are not doing well or get worse. Document Released: 06/05/2005 Document Revised: 10/20/2013 Document Reviewed: 03/28/2013 [facility] Patient Information 2015 [place], [place]. This information is not intended to replace advice given to you by your health care provider. Make sure you discuss any questions you have with your health care provider.

## 2014-05-20 NOTE — ED Provider Notes (Signed)
CSN: [id number]     Arrival date & time 05/20/14  1404 History   First MD Initiated Contact with Patient 05/20/14 1552     Chief Complaint  Patient presents with  . Hypertension     (Consider location/radiation/quality/duration/timing/severity/associated sxs/prior Treatment) HPI Comments: Patient is a 43 year old female with history of hypertension. She presents today with complaints of elevated blood pressure, worse over the past several days. Today while she was at work she developed lightheadedness and shortness of breath. She then checked her blood pressure and it was 160/100. This is much I are than she has ever measured and presents for evaluation of this. She denies to me she is experiencing any chest pain. She denies any other recent illness. She tells her she has been on multiple antihypertensives since March, however none of these have adequately controlled her blood pressure. She is currently taking Ramipril, the dose of which was recently increased from 5-10 mg.  Patient is a 43 y.o. female presenting with hypertension. The history is provided by the patient.  Hypertension This is a chronic problem. The problem occurs constantly. The problem has been rapidly worsening. Associated symptoms include shortness of breath. Pertinent negatives include no chest pain. Nothing aggravates the symptoms. Nothing relieves the symptoms. She has tried nothing for the symptoms. The treatment provided no relief.    Past Medical History  Diagnosis Date  . Hypertension    Past Surgical History  Procedure Laterality Date  . Endometrial ablation    . Tubal ligation    . Foot surgery     No family history on file. History  Substance Use Topics  . Smoking status: Current Every Day Smoker -- 0.50 packs/day  . Smokeless tobacco: Never Used  . Alcohol Use: Yes     Comment: occasional   OB History    No data available     Review of Systems  Respiratory: Positive for shortness of breath.    Cardiovascular: Negative for chest pain.  All other systems reviewed and are negative.     Allergies  Review of patient's allergies indicates no known allergies.  Home Medications   Prior to Admission medications   Medication Sig Start Date End Date Taking? Authorizing Provider  ramipril (ALTACE) 10 MG capsule Take 10 mg by mouth daily.   Yes Historical Provider, MD   BP 143/89 mmHg  Pulse 82  Temp(Src) 99.1 F (37.3 C) (Oral)  Resp 14  Ht 5\' 3"  (1.6 m)  Wt 140 lb (63.504 kg)  BMI 24.81 kg/m2  SpO2 100% Physical Exam  Constitutional: She is oriented to person, place, and time. She appears well-developed and well-nourished. No distress.  HENT:  Head: Normocephalic and atraumatic.  Mouth/Throat: Oropharynx is clear and moist.  Eyes: EOM are normal. Pupils are equal, round, and reactive to light.  Neck: Normal range of motion. Neck supple.  Cardiovascular: Normal rate and regular rhythm.  Exam reveals no gallop and no friction rub.   No murmur heard. Pulmonary/Chest: Effort normal and breath sounds normal. No respiratory distress. She has no wheezes.  Abdominal: Soft. Bowel sounds are normal. She exhibits no distension. There is no tenderness.  Musculoskeletal: Normal range of motion.  Lymphadenopathy:    She has no cervical adenopathy.  Neurological: She is alert and oriented to person, place, and time. No cranial nerve deficit. She exhibits normal muscle tone. Coordination normal.  Skin: Skin is warm and dry. She is not diaphoretic.  Nursing note and vitals reviewed.   ED  Course  Procedures (including critical care time) Labs Review Labs Reviewed  COMPREHENSIVE METABOLIC PANEL  CBC WITH DIFFERENTIAL  TROPONIN I    Imaging Review No results found.   EKG Interpretation   Date/Time:  Wednesday May 20 2014 16:33:18 EST Ventricular Rate:  86 PR Interval:  142 QRS Duration: 82 QT Interval:  354 QTC Calculation: 423 R Axis:   73 Text Interpretation:   Normal sinus rhythm Normal ECG Confirmed by DELOS   MD, Terese Heier ([id number]) on 05/20/2014 11:36:20 PM      MDM   Final diagnoses:  None    Patient presents with complaints of elevated blood pressure and dizziness. She has been on multiple medications since March of this year. She is mildly hypertensive while in the emergency department, however states her blood pressure is much higher prior to coming here. Neurologically intact and workup reveals no evidence of any emergent process. Her EKG and troponin are unchanged and blood counts and electrolytes are normal. She is now feeling better. She will be discharged to home with a prescription for Norvasc that she is to take in addition to her ramipril. She is to keep an eye on her blood pressures and follow-up with her primary Dr. in the next week to discuss the results.    Veryl Speak, MD 05/20/14 [phone]

## 2014-05-20 NOTE — ED Notes (Signed)
C/o elevated BP x 3 days 138/94, 160/99-hx HTN since  march 2015 with several med changes- pt became light headed at work today

## 2014-11-03 ENCOUNTER — Telehealth: Payer: Self-pay | Admitting: *Deleted

## 2014-11-03 NOTE — Telephone Encounter (Signed)
Unable to reach patient at time of Pre-Visit Call.  Left message for patient to return call when available.    

## 2014-11-04 ENCOUNTER — Telehealth: Payer: Self-pay | Admitting: Physician Assistant

## 2014-11-04 ENCOUNTER — Ambulatory Visit (INDEPENDENT_AMBULATORY_CARE_PROVIDER_SITE_OTHER): Payer: No Typology Code available for payment source | Admitting: Physician Assistant

## 2014-11-04 ENCOUNTER — Telehealth: Payer: Self-pay | Admitting: Family Medicine

## 2014-11-04 ENCOUNTER — Encounter: Payer: Self-pay | Admitting: Physician Assistant

## 2014-11-04 VITALS — BP 130/80 | HR 88 | Temp 99.0°F | Ht 63.5 in | Wt 145.8 lb

## 2014-11-04 DIAGNOSIS — R7989 Other specified abnormal findings of blood chemistry: Secondary | ICD-10-CM

## 2014-11-04 DIAGNOSIS — I10 Essential (primary) hypertension: Secondary | ICD-10-CM

## 2014-11-04 DIAGNOSIS — Z720 Tobacco use: Secondary | ICD-10-CM

## 2014-11-04 DIAGNOSIS — R5382 Chronic fatigue, unspecified: Secondary | ICD-10-CM

## 2014-11-04 DIAGNOSIS — G9332 Myalgic encephalomyelitis/chronic fatigue syndrome: Secondary | ICD-10-CM | POA: Insufficient documentation

## 2014-11-04 LAB — TSH: TSH: 0.64 u[IU]/mL (ref 0.35–4.50)

## 2014-11-04 LAB — COMPREHENSIVE METABOLIC PANEL
ALT: 21 U/L (ref 0–35)
AST: 20 U/L (ref 0–37)
Albumin: 4.6 g/dL (ref 3.5–5.2)
Alkaline Phosphatase: 42 U/L (ref 39–117)
BUN: 9 mg/dL (ref 6–23)
CO2: 26 mEq/L (ref 19–32)
Calcium: 9.6 mg/dL (ref 8.4–10.5)
Chloride: 102 mEq/L (ref 96–112)
Creatinine, Ser: 0.78 mg/dL (ref 0.40–1.20)
GFR: 103.5 mL/min (ref >60.00)
Glucose, Bld: 91 mg/dL (ref 70–99)
Potassium: 3.6 mEq/L (ref 3.5–5.1)
Sodium: 134 mEq/L — ABNORMAL LOW (ref 135–145)
Total Bilirubin: 0.7 mg/dL (ref 0.2–1.2)
Total Protein: 7.4 g/dL (ref 6.0–8.3)

## 2014-11-04 LAB — CBC
HCT: 42.2 % (ref 36.0–46.0)
Hemoglobin: 14.3 g/dL (ref 12.0–15.0)
MCHC: 33.8 g/dL (ref 30.0–36.0)
MCV: 92.8 fl (ref 78.0–100.0)
Platelets: 283 10*3/uL (ref 150.0–400.0)
RBC: 4.55 Mil/uL (ref 3.87–5.11)
RDW: 13.3 % (ref 11.5–15.5)
WBC: 8.7 10*3/uL (ref 4.0–10.5)

## 2014-11-04 LAB — VITAMIN D 25 HYDROXY (VIT D DEFICIENCY, FRACTURES): VITD: 9.26 ng/mL — ABNORMAL LOW (ref 30.00–100.00)

## 2014-11-04 LAB — C-REACTIVE PROTEIN: CRP: <0.1 mg/dL — ABNORMAL LOW (ref 0.5–20.0)

## 2014-11-04 LAB — T4, FREE: Free T4: 0.72 ng/dL (ref 0.60–1.60)

## 2014-11-04 MED ORDER — RAMIPRIL 10 MG PO CAPS: 10.0000 mg | ORAL_CAPSULE | Freq: Every day | ORAL | Status: DC

## 2014-11-04 MED ORDER — AMLODIPINE BESYLATE 5 MG PO TABS: 5.0000 mg | ORAL_TABLET | Freq: Every day | ORAL | Status: DC

## 2014-11-04 MED ORDER — VITAMIN D (ERGOCALCIFEROL) 1.25 MG (50000 UNIT) PO CAPS: 50000.0000 [IU] | ORAL_CAPSULE | ORAL | Status: DC

## 2014-11-04 NOTE — Patient Instructions (Addendum)
Please go to the lab for blood work. I will call you with your results. Please continue BP medications as directed. Have Dr. Jerrye Bushy office send over your records to our office -- [phone] (fax).  Follow-up will be based on your lab results.

## 2014-11-04 NOTE — Telephone Encounter (Signed)
New pt to [facility] seeing Elyn Aquas, [place]. Pt filled out medical records release form but it is not signed. Placed in file at front desk and left msg for pt to drop by and sign form so we can request medical records from [facility] Dr. Jesse Fall.

## 2014-11-04 NOTE — Progress Notes (Signed)
Pre visit review using our clinic review tool, if applicable. No additional management support is needed unless otherwise documented below in the visit note. 

## 2014-11-04 NOTE — Telephone Encounter (Signed)
Labs look good overall.  Vitamin D level very low which could be culprit of fatigue. I have sent in Rx for Ergocalciferol 50,000 units once weekly for 10 weeks.  Follow-up 10 weeks for reassessment and repeat labs.

## 2014-11-04 NOTE — Assessment & Plan Note (Signed)
Unclear etiology.  Exam within normal limits. Will obtain lab workup to include CBC, CMP, TSH, T4, CRP and Vitamin D level.

## 2014-11-04 NOTE — Assessment & Plan Note (Signed)
Controlled.  Asymptomatic.  Medications refilled.  DASH diet encouraged. Smoking cessation encouraged.

## 2014-11-04 NOTE — Assessment & Plan Note (Signed)
Patient is attempting smoking cessation efforts on her own currently.  She is to return if she wishes to discuss pharmacotherapy.

## 2014-11-04 NOTE — Telephone Encounter (Signed)
Notified patient of labs and Rx and she stated understanding.  Future vitamin d lab ordered, lab appointment scheduled for 01/15/15.

## 2014-11-04 NOTE — Progress Notes (Signed)
Patient presents to clinic today to establish care.  Acute Concerns: Patient endorses chronic fatigue over the past year associated with mild myalgias. Denies history of anemia or thyroid disorder. Denies vitamin deficiency.  Patient endorses poor diet overall.  Chronic Issues: Hypertension -- Currently on amlodipine and ramipril daily.  Endorses previously well-controlled on current regimen.  Patient denies chest pain, palpitations, lightheadedness, dizziness, vision changes or frequent headaches.  Recent EST unremarkable.  Migraine Headaches --  Endorses migraine headaches averaging once per week.  Endorses photophobia and nausea with headaches.  Has been on multiple abortive therapies but states she is very sensitive to medications and they make her sleepy.    Health Maintenance: Dental -- up-to-date Vision -- up-to-date Immunizations -- Tetanus up-to-date Mammogram -- Last mammogram 2015. No abnormal findings.  PAP -- Last PAP 1 year ago.  Past Medical History  Diagnosis Date  . Hypertension   . Hx of migraine headaches     Past Surgical History  Procedure Laterality Date  . Endometrial ablation    . Tubal ligation    . Foot surgery      No current outpatient prescriptions on file prior to visit.   No current facility-administered medications on file prior to visit.    No Known Allergies  Family History  Problem Relation Age of Onset  . Healthy Child     x 4  . Hypertension Mother   . Hypertension Maternal Grandfather   . Diabetes Mellitus II Paternal Grandmother   . Colon cancer Maternal Grandfather 72    History   Social History  . Marital Status: Married    Spouse Name: Jiles Prows  . Number of Children: 4  . 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 Smoking status: Current Every Day Smoker -- 0.50 packs/day  . Smokeless tobacco: Never Used  . Alcohol Use: 0.0 oz/week    0 Standard drinks or  equivalent per week     Comment: occasional  . Drug Use: No  . Sexual Activity:    Partners: Male    Birth Control/ Protection: None   Other Topics Concern  . Not on file   Social History Narrative   Review of Systems  Constitutional: Positive for malaise/fatigue. Negative for fever, chills, weight loss and diaphoresis.  HENT: Negative for hearing loss and tinnitus.   Eyes: Negative for blurred vision, double vision and photophobia.  Respiratory: Negative for shortness of breath.   Cardiovascular: Negative for chest pain and palpitations.  Musculoskeletal: Positive for myalgias.  Neurological: Positive for headaches. Negative for dizziness, loss of consciousness and weakness.  Psychiatric/Behavioral: Negative for depression, suicidal ideas, hallucinations and substance abuse. The patient is not nervous/anxious and does not have insomnia.    BP 130/80 mmHg  Pulse 88  Temp(Src) 99 F (37.2 C) (Oral)  Ht 5' 3.5" (1.613 m)  Wt 145 lb 12.8 oz (66.134 kg)  BMI 25.42 kg/m2  SpO2 100%  Physical Exam  Constitutional: She is oriented to person, place, and time and well-developed, well-nourished, and in no distress.  HENT:  Head: Normocephalic and atraumatic.  Right Ear: External ear normal.  Left Ear: External ear normal.  Nose: Nose normal.  Mouth/Throat: Oropharynx is clear and moist.  Eyes: Conjunctivae are normal. Pupils are equal, round, and reactive to light.  Neck: Neck supple.  Cardiovascular: Normal rate, regular rhythm, normal heart sounds and intact distal pulses.   Pulmonary/Chest: Effort  normal and breath sounds normal. No respiratory distress. She has no wheezes. She has no rales. She exhibits no tenderness.  Lymphadenopathy:    She has no cervical adenopathy.  Neurological: She is alert and oriented to person, place, and time.  Skin: Skin is warm and dry. No rash noted.  Psychiatric: Affect normal.  Vitals reviewed.  Assessment/Plan: Chronic fatigue Unclear  etiology.  Exam within normal limits. Will obtain lab workup to include CBC, CMP, TSH, T4, CRP and Vitamin D level.   Benign essential HTN Controlled.  Asymptomatic.  Medications refilled.  DASH diet encouraged. Smoking cessation encouraged.     Tobacco abuse disorder Patient is attempting smoking cessation efforts on her own currently.  She is to return if she wishes to discuss pharmacotherapy.

## 2014-12-09 ENCOUNTER — Ambulatory Visit (INDEPENDENT_AMBULATORY_CARE_PROVIDER_SITE_OTHER): Payer: No Typology Code available for payment source | Admitting: Medical

## 2014-12-09 ENCOUNTER — Encounter: Payer: Self-pay | Admitting: Medical

## 2014-12-09 VITALS — BP 123/82 | HR 75 | Temp 99.2°F | Ht 63.5 in | Wt 150.8 lb

## 2014-12-09 DIAGNOSIS — J020 Streptococcal pharyngitis: Secondary | ICD-10-CM | POA: Diagnosis not present

## 2014-12-09 DIAGNOSIS — J029 Acute pharyngitis, unspecified: Secondary | ICD-10-CM | POA: Diagnosis not present

## 2014-12-09 LAB — POCT RAPID STREP A (OFFICE): Rapid Strep A Screen: POSITIVE — AB

## 2014-12-09 MED ORDER — AZITHROMYCIN 250 MG PO TABS: ORAL_TABLET | ORAL | Status: DC

## 2014-12-09 NOTE — Progress Notes (Signed)
Subjective:    Patient ID: Emily Baker, female    DOB: January 16, 1971, 44 y.o.   MRN: [id number]  HPI Pt has sore throat for 1 day. Associated symptom.  Body aches-no Fever- yes Chills-no HA-moderate Neck symptoms-no Lymph node enlargement-yes Rash-no Painful swallowing-yes severe.  Recent strep contact-has grandkids. No known direct contacts.  Endometrial ablation.     Review of Systems  Constitutional: Positive for fatigue. Negative for fever and chills.  HENT: Positive for sore throat. Negative for congestion, hearing loss, rhinorrhea and sinus pressure.   Eyes: Negative for pain.  Respiratory: Negative for cough, chest tightness, shortness of breath and wheezing.   Cardiovascular: Negative for chest pain and palpitations.  Gastrointestinal: Negative for abdominal pain.  Neurological: Positive for headaches. Negative for dizziness, weakness, light-headedness and numbness.  Hematological: Positive for adenopathy.  Psychiatric/Behavioral: Negative for behavioral problems and confusion.   Past Medical History  Diagnosis Date  . Hypertension   . Hx of migraine headaches     History   Social History  . Marital Status: Married    Spouse Name: Jiles Prows  . Number of Children: 4  . 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 Smoking status: Current Every Day Smoker -- 0.50 packs/day  . Smokeless tobacco: Never Used  . Alcohol Use: 0.0 oz/week    0 Standard drinks or equivalent per week     Comment: occasional  . Drug Use: No  . Sexual Activity:    Partners: Male    Birth Control/ Protection: None   Other Topics Concern  . Not on file   Social History Narrative    Past Surgical History  Procedure Laterality Date  . Endometrial ablation    . Tubal ligation    . Foot surgery      Family History  Problem Relation Age of Onset  . Healthy Child     x 4  . Hypertension Mother   . Hypertension  Maternal Grandfather   . Diabetes Mellitus II Paternal Grandmother   . Colon cancer Maternal Grandfather 60    No Known Allergies  Current Outpatient Prescriptions on File Prior to Visit  Medication Sig Dispense Refill  . amLODipine (NORVASC) 5 MG tablet Take 1 tablet (5 mg total) by mouth daily. 30 tablet 3  . ramipril (ALTACE) 10 MG capsule Take 1 capsule (10 mg total) by mouth daily. 30 capsule 1  . Vitamin D, Ergocalciferol, (DRISDOL) 50000 UNITS CAPS capsule Take 1 capsule (50,000 Units total) by mouth every 7 (seven) days. 10 capsule 0   No current facility-administered medications on file prior to visit.    BP 123/82 mmHg  Pulse 75  Temp(Src) 99.2 F (37.3 C) (Oral)  Ht 5' 3.5" (1.613 m)  Wt 150 lb 12.8 oz (68.402 kg)  BMI 26.29 kg/m2  SpO2 100%       Objective:   Physical Exam  General- No acute distress, pleasant pt.  Neck- from, No nuccal rigidity, Mild submandibular node hypertrophy.  Lungs- Clear even and unlabored.  Heart- Regular, rate and rhythm. HEENT- Head- normocephalic Eyes- PEERL bilaterally. Ears- Canals clear, faint dull pink lt tm. Rt tm normal. Nose- No frontal or maxillary sinus tenderness to palpation. Turbinates normal. Throat- posterior pharynx shows 1+   tonsillar hypertrophy plus, moderate erythma,  No  discharge.   Neurologic- CN III- XII grossly intact.      Assessment & Plan:

## 2014-12-09 NOTE — Patient Instructions (Addendum)
Acute pharyngitis Your strep test was positive. I am prescribing azithromycin antibiotic. Rest hydrate, tylenol for fever and warm salt water gargles. Follow up in 7 days or as needed.

## 2014-12-09 NOTE — Progress Notes (Signed)
Pre visit review using our clinic review tool, if applicable. No additional management support is needed unless otherwise documented below in the visit note. 

## 2014-12-09 NOTE — Assessment & Plan Note (Signed)
Your strep test was positive. I am prescribing azithromycin antibiotic. Rest hydrate, tylenol for fever and warm salt water gargles. Follow up in 7 days or as needed.

## 2015-01-05 ENCOUNTER — Encounter: Payer: Self-pay | Admitting: Physician Assistant

## 2015-01-05 ENCOUNTER — Ambulatory Visit (INDEPENDENT_AMBULATORY_CARE_PROVIDER_SITE_OTHER): Payer: No Typology Code available for payment source | Admitting: Physician Assistant

## 2015-01-05 VITALS — BP 118/80 | HR 100 | Temp 98.3°F | Ht 63.5 in | Wt 149.6 lb

## 2015-01-05 DIAGNOSIS — R5382 Chronic fatigue, unspecified: Secondary | ICD-10-CM | POA: Diagnosis not present

## 2015-01-05 LAB — CBC WITH DIFFERENTIAL/PLATELET
Basophils Absolute: 0 10*3/uL (ref 0.0–0.1)
Basophils Relative: 0.5 % (ref 0.0–3.0)
Eosinophils Absolute: 0.1 10*3/uL (ref 0.0–0.7)
Eosinophils Relative: 2 % (ref 0.0–5.0)
HCT: 43 % (ref 36.0–46.0)
Hemoglobin: 14.6 g/dL (ref 12.0–15.0)
Lymphocytes Relative: 47.5 % — ABNORMAL HIGH (ref 12.0–46.0)
Lymphs Abs: 3.4 10*3/uL (ref 0.7–4.0)
MCHC: 34 g/dL (ref 30.0–36.0)
MCV: 94.1 fl (ref 78.0–100.0)
Monocytes Absolute: 0.5 10*3/uL (ref 0.1–1.0)
Monocytes Relative: 6.3 % (ref 3.0–12.0)
Neutro Abs: 3.2 10*3/uL (ref 1.4–7.7)
Neutrophils Relative %: 43.7 % (ref 43.0–77.0)
Platelets: 298 10*3/uL (ref 150.0–400.0)
RBC: 4.57 Mil/uL (ref 3.87–5.11)
RDW: 13.2 % (ref 11.5–15.5)
WBC: 7.2 10*3/uL (ref 4.0–10.5)

## 2015-01-05 LAB — FERRITIN: Ferritin: 193.1 ng/mL (ref 10.0–291.0)

## 2015-01-05 LAB — VITAMIN B12: Vitamin B-12: 437 pg/mL (ref 211–911)

## 2015-01-05 LAB — TSH: TSH: 0.56 u[IU]/mL (ref 0.35–4.50)

## 2015-01-05 NOTE — Patient Instructions (Signed)
Your EKG looks good. Please go to the lab for blood work. Increase fluid intake and make sure to eat a well-balanced diet.  Try to begin some stretching exercises to help with muscle pain. Yoga or swimming may also be beneficial. Alternate between tylenol and ibuprofen for pain if needed.  Continue medications as directed. I will call you with all your results and further instructions.

## 2015-01-05 NOTE — Assessment & Plan Note (Signed)
Unclear etiology. EKG looks good. Previous labs only with abnormal Vitamin D level which has been treated. Will repeat Vitamin D level today. Will obtain CBC, repeat TSH, B12, Ferritin and Lyme Titer. Stretching exercises discussed with patient. Recommend yoga or swimming. Increase fluids. Smoking cessation again encouraged. Will treat based on findings.

## 2015-01-05 NOTE — Progress Notes (Signed)
Patient presents to clinic today c/o continued fatigue despite starting Vitamin D supplementation. Fatigue has been present for > 6 months. Now with muscle aches since last visit. Denies chest pain, palpitations, weight changes, unexplained fevers, night sweats.   Past Medical History  Diagnosis Date  . Hypertension   . Hx of migraine headaches     Current Outpatient Prescriptions on File Prior to Visit  Medication Sig Dispense Refill  . amLODipine (NORVASC) 5 MG tablet Take 1 tablet (5 mg total) by mouth daily. 30 tablet 3  . ramipril (ALTACE) 10 MG capsule Take 1 capsule (10 mg total) by mouth daily. 30 capsule 1  . Vitamin D, Ergocalciferol, (DRISDOL) 50000 UNITS CAPS capsule Take 1 capsule (50,000 Units total) by mouth every 7 (seven) days. 10 capsule 0   No current facility-administered medications on file prior to visit.   No Known Allergies  Family History  Problem Relation Age of Onset  . Healthy Child     x 4  . Hypertension Mother   . Hypertension Maternal Grandfather   . Diabetes Mellitus II Paternal Grandmother   . Colon cancer Maternal Grandfather 74    History   Social History  . Marital Status: Married    Spouse Name: Jiles Prows  . Number of Children: 4  . 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 Smoking status: Current Every Day Smoker -- 0.50 packs/day  . Smokeless tobacco: Never Used  . Alcohol Use: 0.0 oz/week    0 Standard drinks or equivalent per week     Comment: occasional  . Drug Use: No  . Sexual Activity:    Partners: Male    Birth Control/ Protection: None   Other Topics Concern  . None   Social History Narrative   Review of Systems - See HPI.  All other ROS are negative.  BP 118/80 mmHg  Pulse 100  Temp(Src) 98.3 F (36.8 C) (Oral)  Ht 5' 3.5" (1.613 m)  Wt 149 lb 9.6 oz (67.858 kg)  BMI 26.08 kg/m2  SpO2 99%  Physical Exam  Constitutional: She is oriented to  person, place, and time and well-developed, well-nourished, and in no distress.  HENT:  Head: Normocephalic and atraumatic.  Right Ear: External ear normal.  Left Ear: External ear normal.  Nose: Nose normal.  Mouth/Throat: Oropharynx is clear and moist. No oropharyngeal exudate.  TM within normal limits bilaterally.  Eyes: Conjunctivae are normal.  Neck: Neck supple. No thyromegaly present.  Cardiovascular: Normal rate, regular rhythm, normal heart sounds and intact distal pulses.   Pulmonary/Chest: Effort normal and breath sounds normal. No respiratory distress. She has no wheezes. She has no rales. She exhibits no tenderness.  Abdominal: Soft. Bowel sounds are normal.  Lymphadenopathy:    She has no cervical adenopathy.  Neurological: She is alert and oriented to person, place, and time.  Skin: Skin is warm and dry. No rash noted.  Psychiatric: Affect normal.  Vitals reviewed.   Recent Results (from the past 2160 hour(s))  CBC     Status: None   Collection Time: 11/04/14  2:00 PM  Result Value Ref Range   WBC 8.7 4.0 - 10.5 K/uL   RBC 4.55 3.87 - 5.11 Mil/uL   Platelets 283.0 150.0 - 400.0 K/uL   Hemoglobin 14.3 12.0 - 15.0 g/dL   HCT 42.2 36.0 - 46.0 %   MCV 92.8 78.0 - 100.0 fl  MCHC 33.8 30.0 - 36.0 g/dL   RDW 13.3 11.5 - 15.5 %  Comp Met (CMET)     Status: Abnormal   Collection Time: 11/04/14  2:00 PM  Result Value Ref Range   Sodium 134 (L) 135 - 145 mEq/L   Potassium 3.6 3.5 - 5.1 mEq/L   Chloride 102 96 - 112 mEq/L   CO2 26 19 - 32 mEq/L   Glucose, Bld 91 70 - 99 mg/dL   BUN 9 6 - 23 mg/dL   Creatinine, Ser 0.78 0.40 - 1.20 mg/dL   Total Bilirubin 0.7 0.2 - 1.2 mg/dL   Alkaline Phosphatase 42 39 - 117 U/L   AST 20 0 - 37 U/L   ALT 21 0 - 35 U/L   Total Protein 7.4 6.0 - 8.3 g/dL   Albumin 4.6 3.5 - 5.2 g/dL   Calcium 9.6 8.4 - 10.5 mg/dL   GFR 103.50 >60.00 mL/min  TSH     Status: None   Collection Time: 11/04/14  2:00 PM  Result Value Ref Range   TSH  0.64 0.35 - 4.50 uIU/mL  T4, free     Status: None   Collection Time: 11/04/14  2:00 PM  Result Value Ref Range   Free T4 0.72 0.60 - 1.60 ng/dL  C-reactive protein     Status: Abnormal   Collection Time: 11/04/14  2:00 PM  Result Value Ref Range   CRP <0.1 (L) 0.5 - 20.0 mg/dL  Vitamin D (25 hydroxy)     Status: Abnormal   Collection Time: 11/04/14  2:00 PM  Result Value Ref Range   VITD 9.26 (L) 30.00 - 100.00 ng/mL  POCT rapid strep A     Status: Abnormal   Collection Time: 12/09/14 12:06 PM  Result Value Ref Range   Rapid Strep A Screen Positive (A) Negative    Assessment/Plan: Chronic fatigue Unclear etiology. EKG looks good. Previous labs only with abnormal Vitamin D level which has been treated. Will repeat Vitamin D level today. Will obtain CBC, repeat TSH, B12, Ferritin and Lyme Titer. Stretching exercises discussed with patient. Recommend yoga or swimming. Increase fluids. Smoking cessation again encouraged. Will treat based on findings.

## 2015-01-05 NOTE — Progress Notes (Signed)
Pre visit review using our clinic review tool, if applicable. No additional management support is needed unless otherwise documented below in the visit note. 

## 2015-01-07 LAB — B. BURGDORFI ANTIBODIES: B burgdorferi Ab IgG+IgM: 0.45 {ISR}

## 2015-01-08 ENCOUNTER — Telehealth: Payer: Self-pay | Admitting: Physician Assistant

## 2015-01-08 LAB — VITAMIN D 1,25 DIHYDROXY
Vitamin D 1, 25 (OH)2 Total: 29 pg/mL (ref 18–72)
Vitamin D2 1, 25 (OH)2: 29 pg/mL
Vitamin D3 1, 25 (OH)2: <8 pg/mL

## 2015-01-08 NOTE — Telephone Encounter (Signed)
Caller name:Emily Baker Relationship to patient:Self Can be reached:[phone] Pharmacy:  Reason for call:please call with lab results

## 2015-01-08 NOTE — Telephone Encounter (Signed)
-----   Message from Brunetta Jeans, PA-C sent at 01/08/2015  6:59 AM EDT ----- Vitamin D and Lyme titer resulted. All within normal limits. No evident cause of fatigue now that her Vitamin D is within normal limits, although still low normal. Recommend Vitamin D 2,000 units daily. Can get over the counter. May also want to consider if mood (anxiety/depression) are cause of fatigue as all labs have overall looked good. A lot of time depression presents as some decreased mood but changes to sleep and eating, fatigue, body aches are also very common symptoms. Would like to see her at her convenience to talk about this in further detail.

## 2015-01-08 NOTE — Telephone Encounter (Signed)
Called and [facility] @ 8:57am @ [phone]) asking the pt to RTC regarding lab results.//AB/CMA

## 2015-01-11 NOTE — Telephone Encounter (Signed)
Called and spoke with the pt and informed her of recent lab results and note.   Pt verbalized understanding and agreed. //AB/CMA

## 2015-01-15 ENCOUNTER — Other Ambulatory Visit: Payer: No Typology Code available for payment source

## 2015-02-17 ENCOUNTER — Encounter: Payer: Self-pay | Admitting: Physician Assistant

## 2015-02-17 ENCOUNTER — Ambulatory Visit (INDEPENDENT_AMBULATORY_CARE_PROVIDER_SITE_OTHER): Payer: No Typology Code available for payment source | Admitting: Physician Assistant

## 2015-02-17 VITALS — BP 124/84 | HR 93 | Temp 99.2°F | Resp 16 | Ht 63.5 in | Wt 147.0 lb

## 2015-02-17 DIAGNOSIS — B9689 Other specified bacterial agents as the cause of diseases classified elsewhere: Secondary | ICD-10-CM | POA: Insufficient documentation

## 2015-02-17 DIAGNOSIS — J019 Acute sinusitis, unspecified: Secondary | ICD-10-CM | POA: Diagnosis not present

## 2015-02-17 MED ORDER — AMOXICILLIN-POT CLAVULANATE 875-125 MG PO TABS: 1.0000 | ORAL_TABLET | Freq: Two times a day (BID) | ORAL | Status: DC

## 2015-02-17 NOTE — Progress Notes (Signed)
Patient presents to clinic today c/o 5 days of progressively worsening sinus pressure, sinus pain, cough, PND and low-grade fever. Denies ear pain or tooth pain. Endorses sinus headaches. Denies chest pain, SOB, recent travel or sick contact.  Past Medical History  Diagnosis Date  . Hypertension   . Hx of migraine headaches     Current Outpatient Prescriptions on File Prior to Visit  Medication Sig Dispense Refill  . amLODipine (NORVASC) 5 MG tablet Take 1 tablet (5 mg total) by mouth daily. 30 tablet 3  . ramipril (ALTACE) 10 MG capsule Take 1 capsule (10 mg total) by mouth daily. 30 capsule 1   No current facility-administered medications on file prior to visit.    No Known Allergies  Family History  Problem Relation Age of Onset  . Healthy Child     x 4  . Hypertension Mother   . Hypertension Maternal Grandfather   . Diabetes Mellitus II Paternal Grandmother   . Colon cancer Maternal Grandfather 73    Social History   Social History  . Marital Status: Married    Spouse Name: Jiles Prows  . Number of Children: 4  . 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 Smoking status: Current Every Day Smoker -- 0.50 packs/day  . Smokeless tobacco: Never Used  . Alcohol Use: 0.0 oz/week    0 Standard drinks or equivalent per week     Comment: occasional  . Drug Use: No  . Sexual Activity:    Partners: Male    Birth Control/ Protection: None   Other Topics Concern  . None   Social History Narrative   Review of Systems - See HPI.  All other ROS are negative.  BP 124/84 mmHg  Pulse 93  Temp(Src) 99.2 F (37.3 C) (Oral)  Resp 16  Ht 5' 3.5" (1.613 m)  Wt 147 lb (66.679 kg)  BMI 25.63 kg/m2  SpO2 99%  Physical Exam  Constitutional: She is oriented to person, place, and time and well-developed, well-nourished, and in no distress.  HENT:  Head: Normocephalic and atraumatic.  Right Ear: Tympanic membrane  normal.  Left Ear: Tympanic membrane normal.  Nose: Right sinus exhibits maxillary sinus tenderness and frontal sinus tenderness.  Mouth/Throat: Uvula is midline, oropharynx is clear and moist and mucous membranes are normal.  Eyes: Conjunctivae are normal.  Neck: Neck supple.  Cardiovascular: Normal rate, regular rhythm, normal heart sounds and intact distal pulses.   Pulmonary/Chest: Effort normal and breath sounds normal. No respiratory distress. She has no wheezes. She has no rales. She exhibits no tenderness.  Neurological: She is alert and oriented to person, place, and time.  Skin: Skin is warm and dry. No rash noted.  Psychiatric: Affect normal.  Vitals reviewed.   Recent Results (from the past 2160 hour(s))  POCT rapid strep A     Status: Abnormal   Collection Time: 12/09/14 12:06 PM  Result Value Ref Range   Rapid Strep A Screen Positive (A) Negative  CBC w/Diff     Status: Abnormal   Collection Time: 01/05/15  8:15 AM  Result Value Ref Range   WBC 7.2 4.0 - 10.5 K/uL   RBC 4.57 3.87 - 5.11 Mil/uL   Hemoglobin 14.6 12.0 - 15.0 g/dL   HCT 43.0 36.0 - 46.0 %   MCV 94.1 78.0 - 100.0 fl   MCHC 34.0 30.0 - 36.0 g/dL   RDW 13.2  11.5 - 15.5 %   Platelets 298.0 150.0 - 400.0 K/uL   Neutrophils Relative % 43.7 43.0 - 77.0 %   Lymphocytes Relative 47.5 (H) 12.0 - 46.0 %   Monocytes Relative 6.3 3.0 - 12.0 %   Eosinophils Relative 2.0 0.0 - 5.0 %   Basophils Relative 0.5 0.0 - 3.0 %   Neutro Abs 3.2 1.4 - 7.7 K/uL   Lymphs Abs 3.4 0.7 - 4.0 K/uL   Monocytes Absolute 0.5 0.1 - 1.0 K/uL   Eosinophils Absolute 0.1 0.0 - 0.7 K/uL   Basophils Absolute 0.0 0.0 - 0.1 K/uL  B12     Status: None   Collection Time: 01/05/15  8:15 AM  Result Value Ref Range   Vitamin B-12 437 211 - 911 pg/mL  Vitamin D 1,25 dihydroxy     Status: None   Collection Time: 01/05/15  8:15 AM  Result Value Ref Range   Vitamin D 1, 25 (OH)2 Total 29 18 - 72 pg/mL   Vitamin D3 1, 25 (OH)2 <8 pg/mL    Vitamin D2 1, 25 (OH)2 29 pg/mL    Comment: Vitamin D3, 1,25(OH)2 indicates both endogenous production and supplementation.  Vitamin D2, 1,25(OH)2 is an indicator of exogeous sources, such as diet or supplementation.  Interpretation and therapy are based on measurement of Vitamin D,1,25(OH)2, Total. This test was developed and its analytical performance characteristics have been determined by [facility], [place], [place]. It has not been cleared or approved by the FDA. This assay has been validated pursuant to the CLIA regulations and is used for clinical purposes.   B. Burgdorfi Antibodies     Status: None   Collection Time: 01/05/15  8:15 AM  Result Value Ref Range   B burgdorferi Ab IgG+IgM 0.45 ISR    Comment: Antibody to Borrelia burgdorferi not detected.      ISR = Immune Status Ratio              <0.90         ISR       Negative              0.90 - 1.09   ISR       Equivocal              >=1.10        ISR       Positive   TSH     Status: None   Collection Time: 01/05/15  8:15 AM  Result Value Ref Range   TSH 0.56 0.35 - 4.50 uIU/mL  Ferritin     Status: None   Collection Time: 01/05/15  8:15 AM  Result Value Ref Range   Ferritin 193.1 10.0 - 291.0 ng/mL    Assessment/Plan: No problem-specific assessment & plan notes found for this encounter.

## 2015-02-17 NOTE — Patient Instructions (Signed)
Please take antibiotic as directed.  Increase fluid intake.  Use Saline nasal spray.  Take a daily multivitamin. Continue Mucinex. Place a humidifier in the bedroom.  Please call or return clinic if symptoms are not improving.  Sinusitis Sinusitis is redness, soreness, and swelling (inflammation) of the paranasal sinuses. Paranasal sinuses are air pockets within the bones of your face (beneath the eyes, the middle of the forehead, or above the eyes). In healthy paranasal sinuses, mucus is able to drain out, and air is able to circulate through them by way of your nose. However, when your paranasal sinuses are inflamed, mucus and air can become trapped. This can allow bacteria and other germs to grow and cause infection. Sinusitis can develop quickly and last only a short time (acute) or continue over a long period (chronic). Sinusitis that lasts for more than 12 weeks is considered chronic.  CAUSES  Causes of sinusitis include:  Allergies.  Structural abnormalities, such as displacement of the cartilage that separates your nostrils (deviated septum), which can decrease the air flow through your nose and sinuses and affect sinus drainage.  Functional abnormalities, such as when the small hairs (cilia) that line your sinuses and help remove mucus do not work properly or are not present. SYMPTOMS  Symptoms of acute and chronic sinusitis are the same. The primary symptoms are pain and pressure around the affected sinuses. Other symptoms include:  Upper toothache.  Earache.  Headache.  Bad breath.  Decreased sense of smell and taste.  A cough, which worsens when you are lying flat.  Fatigue.  Fever.  Thick drainage from your nose, which often is green and may contain pus (purulent).  Swelling and warmth over the affected sinuses. DIAGNOSIS  Your caregiver will perform a physical exam. During the exam, your caregiver may:  Look in your nose for signs of abnormal growths in your  nostrils (nasal polyps).  Tap over the affected sinus to check for signs of infection.  View the inside of your sinuses (endoscopy) with a special imaging device with a light attached (endoscope), which is inserted into your sinuses. If your caregiver suspects that you have chronic sinusitis, one or more of the following tests may be recommended:  Allergy tests.  Nasal culture A sample of mucus is taken from your nose and sent to a lab and screened for bacteria.  Nasal cytology A sample of mucus is taken from your nose and examined by your caregiver to determine if your sinusitis is related to an allergy. TREATMENT  Most cases of acute sinusitis are related to a viral infection and will resolve on their own within 10 days. Sometimes medicines are prescribed to help relieve symptoms (pain medicine, decongestants, nasal steroid sprays, or saline sprays).  However, for sinusitis related to a bacterial infection, your caregiver will prescribe antibiotic medicines. These are medicines that will help kill the bacteria causing the infection.  Rarely, sinusitis is caused by a fungal infection. In theses cases, your caregiver will prescribe antifungal medicine. For some cases of chronic sinusitis, surgery is needed. Generally, these are cases in which sinusitis recurs more than 3 times per year, despite other treatments. HOME CARE INSTRUCTIONS   Drink plenty of water. Water helps thin the mucus so your sinuses can drain more easily.  Use a humidifier.  Inhale steam 3 to 4 times a day (for example, sit in the bathroom with the shower running).  Apply a warm, moist washcloth to your face 3 to 4 times a   day, or as directed by your caregiver.  Use saline nasal sprays to help moisten and clean your sinuses.  Take over-the-counter or prescription medicines for pain, discomfort, or fever only as directed by your caregiver. SEEK IMMEDIATE MEDICAL CARE IF:  You have increasing pain or severe  headaches.  You have nausea, vomiting, or drowsiness.  You have swelling around your face.  You have vision problems.  You have a stiff neck.  You have difficulty breathing. MAKE SURE YOU:   Understand these instructions.  Will watch your condition.  Will get help right away if you are not doing well or get worse. Document Released: 06/05/2005 Document Revised: 08/28/2011 Document Reviewed: 06/20/2011 Exit[employer] Patient Information 2014 [employer].   

## 2015-02-17 NOTE — Assessment & Plan Note (Signed)
Rx Augmentin.  Increase fluids.  Rest.  Saline nasal spray.  Probiotic.  Mucinex as directed.  Humidifier in bedroom.  Call or return to clinic if symptoms are not improving.  

## 2015-02-17 NOTE — Progress Notes (Signed)
Pre visit review using our clinic review tool, if applicable. No additional management support is needed unless otherwise documented below in the visit note/SLS  

## 2015-06-01 ENCOUNTER — Telehealth: Payer: Self-pay | Admitting: Physician Assistant

## 2015-06-01 NOTE — Telephone Encounter (Signed)
Pt declined flu shot. She has not received since 1998 and does not want to.

## 2015-06-01 NOTE — Telephone Encounter (Signed)
Noted in HM. 

## 2015-06-07 ENCOUNTER — Encounter: Payer: Self-pay | Admitting: Physician Assistant

## 2015-06-07 ENCOUNTER — Ambulatory Visit (INDEPENDENT_AMBULATORY_CARE_PROVIDER_SITE_OTHER): Payer: No Typology Code available for payment source | Admitting: Physician Assistant

## 2015-06-07 VITALS — BP 130/88 | HR 80 | Temp 98.4°F | Ht 63.5 in | Wt 154.6 lb

## 2015-06-07 DIAGNOSIS — M545 Low back pain, unspecified: Secondary | ICD-10-CM | POA: Insufficient documentation

## 2015-06-07 MED ORDER — NAPROXEN SODIUM 550 MG PO TABS: 550.0000 mg | ORAL_TABLET | Freq: Two times a day (BID) | ORAL | Status: DC

## 2015-06-07 MED ORDER — CYCLOBENZAPRINE HCL 10 MG PO TABS: 10.0000 mg | ORAL_TABLET | Freq: Three times a day (TID) | ORAL | Status: DC | PRN

## 2015-06-07 NOTE — Patient Instructions (Signed)
Please take medications as directed. Do not take the Flexeril if you will be driving. Limit heavy lifting or overexertion. Continue alternating ice and heat to the area. Follow-up if symptoms are not resolving.

## 2015-06-07 NOTE — Progress Notes (Signed)
   Patient presents to clinic today c/o bilateral lower back pain worse with movement since Friday morning. Denies trauma, injury or heavy lifting. Some rare radiation into R buttock but not into lower extremity. Has tried multiple OTC regimens without much improvement in symptoms.   Past Medical History  Diagnosis Date  . Hypertension   . Hx of migraine headaches     Current Outpatient Prescriptions on File Prior to Visit  Medication Sig Dispense Refill  . amLODipine (NORVASC) 5 MG tablet Take 1 tablet (5 mg total) by mouth daily. 30 tablet 3  . ramipril (ALTACE) 10 MG capsule Take 1 capsule (10 mg total) by mouth daily. 30 capsule 1   No current facility-administered medications on file prior to visit.    No Known Allergies  Family History  Problem Relation Age of Onset  . Healthy Child     x 4  . Hypertension Mother   . Hypertension Maternal Grandfather   . Diabetes Mellitus II Paternal Grandmother   . Colon cancer Maternal Grandfather 68    Social History   Social History  . Marital Status: Married    Spouse Name: Jiles Prows  . Number of Children: 4  . 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 Smoking status: Current Every Day Smoker -- 0.50 packs/day  . Smokeless tobacco: Never Used  . Alcohol Use: 0.0 oz/week    0 Standard drinks or equivalent per week     Comment: occasional  . Drug Use: No  . Sexual Activity:    Partners: Male    Birth Control/ Protection: None   Other Topics Concern  . None   Social History Narrative    Review of Systems - See HPI.  All other ROS are negative.  BP 130/88 mmHg  Pulse 80  Temp(Src) 98.4 F (36.9 C) (Oral)  Ht 5' 3.5" (1.613 m)  Wt 154 lb 9.6 oz (70.126 kg)  BMI 26.95 kg/m2  SpO2 99%  Physical Exam  Constitutional: She is oriented to person, place, and time and well-developed, well-nourished, and in no distress.  Cardiovascular: Normal rate, regular  rhythm, normal heart sounds and intact distal pulses.   Pulmonary/Chest: Effort normal.  Musculoskeletal:       Lumbar back: She exhibits tenderness, pain and spasm. She exhibits no bony tenderness.  Neurological: She is alert and oriented to person, place, and time.  Skin: Skin is warm and dry. No rash noted.  Psychiatric: Affect normal.  Vitals reviewed.   No results found for this or any previous visit (from the past 2160 hour(s)).  Assessment/Plan: Bilateral low back pain without sciatica Rx Anaprox DS with food. Rx Flexeril as directed. Supportive measures and stretches reviewed. Follow-up if not resolving.

## 2015-06-07 NOTE — Assessment & Plan Note (Signed)
Rx Anaprox DS with food. Rx Flexeril as directed. Supportive measures and stretches reviewed. Follow-up if not resolving.

## 2015-06-07 NOTE — Progress Notes (Signed)
Pre visit review using our clinic review tool, if applicable. No additional management support is needed unless otherwise documented below in the visit note. 

## 2015-06-08 ENCOUNTER — Telehealth: Payer: Self-pay | Admitting: Physician Assistant

## 2015-06-08 NOTE — Telephone Encounter (Signed)
Pt called to see if she can get work note extended to write her out at least tomorrow and she'll call if not better. Please fax to her job at fax# [phone], Attn: Milon Score.

## 2015-06-08 NOTE — Telephone Encounter (Signed)
Ok to extend through tomorrow

## 2015-06-09 NOTE — Telephone Encounter (Signed)
Letter done and faxed to the pt's job to the Attn:Cheryl Fisher.//AB/CMA

## 2015-06-10 ENCOUNTER — Telehealth: Payer: Self-pay | Admitting: Physician Assistant

## 2015-06-10 NOTE — Telephone Encounter (Signed)
Caller name: Self   Can be reached: [phone]   Reason for call: Patient requesting another note for work allowing her to stay out until 12/27

## 2015-06-10 NOTE — Telephone Encounter (Signed)
Ok to extend note to date below. No further extension will be given without follow-up in office.

## 2015-06-11 NOTE — Telephone Encounter (Addendum)
Called and spoke with the pt and informed her of the note below.  Pt verbalized understanding and agreed.  New note done and faxed to the pt's job.//AB/CMA

## 2015-10-18 LAB — HM PAP SMEAR: HM Pap smear: NORMAL

## 2016-05-04 ENCOUNTER — Encounter: Payer: Self-pay | Admitting: Family Medicine

## 2016-05-04 ENCOUNTER — Ambulatory Visit (INDEPENDENT_AMBULATORY_CARE_PROVIDER_SITE_OTHER): Payer: BLUE CROSS/BLUE SHIELD | Admitting: Family Medicine

## 2016-05-04 VITALS — BP 126/78 | HR 86 | Temp 98.4°F | Ht 63.0 in | Wt 147.2 lb

## 2016-05-04 DIAGNOSIS — J0120 Acute ethmoidal sinusitis, unspecified: Secondary | ICD-10-CM | POA: Diagnosis not present

## 2016-05-04 LAB — POCT INFLUENZA A/B
Influenza A, POC: NEGATIVE
Influenza B, POC: NEGATIVE

## 2016-05-04 LAB — POCT RAPID STREP A (OFFICE): Rapid Strep A Screen: NEGATIVE

## 2016-05-04 NOTE — Progress Notes (Signed)
Chief Complaint  Patient presents with  . Nasal Congestion    Pt reports stuffiness with headache and pressure    Emily Baker is 45 y.o. female here for possible sinus infection.  Duration: 4 days Symptoms include: nasal congestion, clear rhinorrhea, cough, sore throats, headaches, facial pain, itchy eyes Denies: purulent rhinorrhea, fevers Treatment to date: Mucinex  Sick contacts? Yes- work  ROS:  Const: Denies fevers HEENT: As noted in HPI  Past Medical History:  Diagnosis Date  . Hx of migraine headaches   . Hypertension    Social History   Social History  . Marital status: Married    Spouse name: Tyrone  . Number of children: 4   Occupational History  . Medical Billing    Social History Main Topics  . Smoking status: Current Every Day Smoker    Packs/day: 0.50  . Smokeless tobacco: Never Used  . Alcohol use 0.0 oz/week     Comment: occasional  . Drug use: No  . Sexual activity: Yes    Partners: Male    Birth control/ protection: None   Family History  Problem Relation Age of Onset  . Healthy Child     x 4  . Hypertension Mother   . Hypertension Maternal Grandfather   . Diabetes Mellitus II Paternal Grandmother   . Colon cancer Maternal Grandfather 60    BP 126/78 (BP Location: Left Arm, Patient Position: Sitting, Cuff Size: Small)   Pulse 86   Temp 98.4 F (36.9 C) (Oral)   Ht 5\' 3"  (1.6 m)   Wt 147 lb 3.2 oz (66.8 kg)   LMP 06/20/2007   SpO2 98%   BMI 26.08 kg/m  Gen- Awake, alert, appears stated age HEENT- Ears patent, TM's neg b/l, nares are patent, frontal sinuses are mildly TTP, MMM, pharynx is not erythematous or with exudate Heart- RRR, no murmur, no bruits Lungs- CTAB, no accessory muscle use Psych- Age appropriate judgment and insight, nml mood and affect  Acute ethmoidal sinusitis, recurrence not specified  Orders as above. Discussed abx use and that >95% of sinus infections are bacterial. OTC remedies recommended in  AVS. Call by next Wed if symptoms are worsening. Letter for work given. F/u prn. The patient voiced understanding and agreement to the plan.  [place], DO 05/04/16 9:58 AM

## 2016-05-04 NOTE — Progress Notes (Signed)
Pre visit review using our clinic review tool, if applicable. No additional management support is needed unless otherwise documented below in the visit note. 

## 2016-05-04 NOTE — Patient Instructions (Addendum)
Claritin (loratadine), Allegra (fexofenadine), Zyrtec (cetirizine); these are listed in order from weakest to strongest. Generic, and therefore cheaper, options are in the parentheses.   Flonase (fluticasone); nasal spray that is over the counter. 2 sprays each nostril, once daily. Aim towards the same side eye when you spray.  There are available OTC, and the generic versions, which may be cheaper, are in parentheses. Show this to a pharmacist if you have trouble finding any of these items.  Call next Wed, 11/22, if your symptoms are worsening.

## 2016-05-04 NOTE — Addendum Note (Signed)
Addended by: Shallowater Cellar on: 05/04/2016 11:05 AM   Modules accepted: Orders

## 2016-05-05 ENCOUNTER — Encounter: Payer: Self-pay | Admitting: Family Medicine

## 2016-05-08 ENCOUNTER — Other Ambulatory Visit: Payer: Self-pay | Admitting: Family Medicine

## 2016-05-08 MED ORDER — AMOXICILLIN-POT CLAVULANATE 875-125 MG PO TABS: 1.0000 | ORAL_TABLET | Freq: Two times a day (BID) | ORAL | 0 refills | Status: DC

## 2016-07-23 ENCOUNTER — Telehealth: Payer: No Typology Code available for payment source | Admitting: Family

## 2016-07-23 DIAGNOSIS — J209 Acute bronchitis, unspecified: Secondary | ICD-10-CM

## 2016-07-23 MED ORDER — BENZONATATE 100 MG PO CAPS: 100.0000 mg | ORAL_CAPSULE | Freq: Three times a day (TID) | ORAL | 0 refills | Status: DC | PRN

## 2016-07-23 MED ORDER — AZITHROMYCIN 250 MG PO TABS: ORAL_TABLET | ORAL | 0 refills | Status: DC

## 2016-07-23 NOTE — Progress Notes (Signed)

## 2016-11-02 ENCOUNTER — Ambulatory Visit: Payer: BLUE CROSS/BLUE SHIELD | Admitting: Medical

## 2016-12-09 ENCOUNTER — Encounter: Payer: Self-pay | Admitting: Physician Assistant

## 2016-12-09 ENCOUNTER — Telehealth: Payer: No Typology Code available for payment source | Admitting: Physician Assistant

## 2016-12-09 DIAGNOSIS — B9689 Other specified bacterial agents as the cause of diseases classified elsewhere: Secondary | ICD-10-CM

## 2016-12-09 DIAGNOSIS — J019 Acute sinusitis, unspecified: Secondary | ICD-10-CM

## 2016-12-09 MED ORDER — AMOXICILLIN-POT CLAVULANATE 875-125 MG PO TABS: 1.0000 | ORAL_TABLET | Freq: Two times a day (BID) | ORAL | 0 refills | Status: DC

## 2016-12-09 NOTE — Progress Notes (Signed)

## 2017-06-19 HISTORY — PX: BREAST EXCISIONAL BIOPSY: SUR124

## 2017-07-04 ENCOUNTER — Telehealth: Payer: BLUE CROSS/BLUE SHIELD | Admitting: Family

## 2017-07-04 DIAGNOSIS — G43909 Migraine, unspecified, not intractable, without status migrainosus: Secondary | ICD-10-CM

## 2017-07-04 DIAGNOSIS — R110 Nausea: Secondary | ICD-10-CM

## 2017-07-04 MED ORDER — ONDANSETRON HCL 4 MG PO TABS: 4.0000 mg | ORAL_TABLET | Freq: Three times a day (TID) | ORAL | 0 refills | Status: DC | PRN

## 2017-07-04 NOTE — Progress Notes (Signed)
We are sorry that you are not feeling well. Here is how we plan to help!  Based on what you have shared with me it looks like you have a migraine.  When we treat short term symptoms, we always caution that any symptoms that persist should be fully evaluated in a medical office.  I have prescribed a medication that will help alleviate your symptoms and allow you to stay hydrated:  Zofran 4 mg 1 tablet every 8 hours as needed for nausea and vomiting  HOME CARE:  Drink clear liquids.  This is very important! Dehydration (the lack of fluid) can lead to a serious complication.  Start off with 1 tablespoon every 5 minutes for 8 hours.  You may begin eating bland foods after 8 hours without vomiting.  Start with saltine crackers, white bread, rice, mashed potatoes, applesauce.  After 48 hours on a bland diet, you may resume a normal diet.  Try to go to sleep.  Sleep often empties the stomach and relieves the need to vomit.  GET HELP RIGHT AWAY IF:   Your symptoms do not improve or worsen within 2 days after treatment.  You have a fever for over 3 days.  You cannot keep down fluids after trying the medication.  MAKE SURE YOU:   Understand these instructions.  Will watch your condition.  Will get help right away if you are not doing well or get worse.   Thank you for choosing an e-visit. Your e-visit answers were reviewed by a board certified advanced clinical practitioner to complete your personal care plan. Depending upon the condition, your plan could have included both over the counter or prescription medications. Please review your pharmacy choice. Be sure that the pharmacy you have chosen is open so that you can pick up your prescription now.  If there is a problem you may message your provider in [place] to have the prescription routed to another pharmacy. Your safety is important to [place]. If you have drug allergies check your prescription carefully.  For the next 24 hours, you  can use MyChart to ask questions about today's visit, request a non-urgent call back, or ask for a work or school excuse from your e-visit provider. You will get an e-mail in the next two days asking about your experience. I hope that your e-visit has been valuable and will speed your recovery.

## 2017-07-23 DIAGNOSIS — R920 Mammographic microcalcification found on diagnostic imaging of breast: Secondary | ICD-10-CM | POA: Insufficient documentation

## 2017-07-30 DIAGNOSIS — R928 Other abnormal and inconclusive findings on diagnostic imaging of breast: Secondary | ICD-10-CM | POA: Insufficient documentation

## 2018-03-26 DIAGNOSIS — Z23 Encounter for immunization: Secondary | ICD-10-CM | POA: Diagnosis not present

## 2018-05-02 ENCOUNTER — Telehealth: Payer: BLUE CROSS/BLUE SHIELD | Admitting: Family

## 2018-05-02 DIAGNOSIS — J029 Acute pharyngitis, unspecified: Secondary | ICD-10-CM

## 2018-05-02 MED ORDER — BENZONATATE 100 MG PO CAPS: 100.0000 mg | ORAL_CAPSULE | Freq: Three times a day (TID) | ORAL | 0 refills | Status: DC | PRN

## 2018-05-02 NOTE — Progress Notes (Signed)
Thank you for the details you included in the comment boxes. Those details are very helpful in determining the best course of treatment for you and help [place] to provide the best care.  We are sorry that you are not feeling well.  Here is how we plan to help!  Based on your presentation I believe you most likely have A cough due to a virus.  This is called viral bronchitis and is best treated by rest, plenty of fluids and control of the cough.  You may use Ibuprofen or Tylenol as directed to help your symptoms.     In addition you may use A non-prescription cough medication called Mucinex DM: take 2 tablets every 12 hours. and A prescription cough medication called Tessalon Perles 100mg . You may take 1-2 capsules every 8 hours as needed for your cough.    From your responses in the eVisit questionnaire you describe inflammation in the upper respiratory tract which is causing a significant cough.  This is commonly called Bronchitis and has four common causes:    Allergies  Viral Infections  Acid Reflux  Bacterial Infection Allergies, viruses and acid reflux are treated by controlling symptoms or eliminating the cause. An example might be a cough caused by taking certain blood pressure medications. You stop the cough by changing the medication. Another example might be a cough caused by acid reflux. Controlling the reflux helps control the cough.  USE OF BRONCHODILATOR ("RESCUE") INHALERS: There is a risk from using your bronchodilator too frequently.  The risk is that over-reliance on a medication which only relaxes the muscles surrounding the breathing tubes can reduce the effectiveness of medications prescribed to reduce swelling and congestion of the tubes themselves.  Although you feel brief relief from the bronchodilator inhaler, your asthma may actually be worsening with the tubes becoming more swollen and filled with mucus.  This can delay other crucial treatments, such as oral steroid  medications. If you need to use a bronchodilator inhaler daily, several times per day, you should discuss this with your provider.  There are probably better treatments that could be used to keep your asthma under control.     HOME CARE . Only take medications as instructed by your medical team. . Complete the entire course of an antibiotic. . Drink plenty of fluids and get plenty of rest. . Avoid close contacts especially the very young and the elderly . Cover your mouth if you cough or cough into your sleeve. . Always remember to wash your hands . A steam or ultrasonic humidifier can help congestion.   GET HELP RIGHT AWAY IF: . You develop worsening fever. . You become short of breath . You cough up blood. . Your symptoms persist after you have completed your treatment plan MAKE SURE YOU   Understand these instructions.  Will watch your condition.  Will get help right away if you are not doing well or get worse.  Your e-visit answers were reviewed by a board certified advanced clinical practitioner to complete your personal care plan.  Depending on the condition, your plan could have included both over the counter or prescription medications. If there is a problem please reply  once you have received a response from your provider. Your safety is important to [place].  If you have drug allergies check your prescription carefully.    You can use MyChart to ask questions about today's visit, request a non-urgent call back, or ask for a work or school excuse  for 24 hours related to this e-Visit. If it has been greater than 24 hours you will need to follow up with your provider, or enter a new e-Visit to address those concerns. You will get an e-mail in the next two days asking about your experience.  I hope that your e-visit has been valuable and will speed your recovery. Thank you for using e-visits.

## 2018-05-08 ENCOUNTER — Telehealth: Payer: BLUE CROSS/BLUE SHIELD | Admitting: Nurse Practitioner

## 2018-05-08 DIAGNOSIS — R05 Cough: Secondary | ICD-10-CM

## 2018-05-08 DIAGNOSIS — R059 Cough, unspecified: Secondary | ICD-10-CM

## 2018-05-08 MED ORDER — AZITHROMYCIN 250 MG PO TABS: ORAL_TABLET | ORAL | 0 refills | Status: DC

## 2018-05-08 NOTE — Progress Notes (Signed)

## 2018-05-29 ENCOUNTER — Encounter: Payer: BLUE CROSS/BLUE SHIELD | Admitting: Family Medicine

## 2018-05-29 ENCOUNTER — Encounter: Payer: Self-pay | Admitting: Family Medicine

## 2018-05-29 ENCOUNTER — Ambulatory Visit: Payer: BLUE CROSS/BLUE SHIELD | Admitting: Family Medicine

## 2018-05-29 VITALS — BP 112/72 | HR 87 | Temp 98.5°F | Ht 63.0 in | Wt 143.2 lb

## 2018-05-29 DIAGNOSIS — Z0000 Encounter for general adult medical examination without abnormal findings: Secondary | ICD-10-CM | POA: Diagnosis not present

## 2018-05-29 NOTE — Patient Instructions (Signed)
Give [place] 2-3 business days to get the results of your labs back.   Keep the diet clean and stay active.  Aim to do some physical exertion for 150 minutes per week. This is typically divided into 5 days per week, 30 minutes per day. The activity should be enough to get your heart rate up. Anything is better than nothing if you have time constraints.  Check blood pressures at home over next few weeks. I want your top number to be below 140 and the bottom number below 90.   Let us know if you need anything.

## 2018-05-29 NOTE — Progress Notes (Signed)
Chief Complaint  Patient presents with  . New Patient (Initial Visit)     Well Woman Emily Baker is here for a complete physical.   Her last physical was >1 year ago.  Current diet: in general, a "healthy" diet. Current exercise: none. Weight is stable and she denies daytime fatigue. No LMP recorded. Patient has had an ablation. Seatbelt? Yes  Health Maintenance Pap/HPV- Yes Mammogram- Yes Tetanus- Yes Hep C screening- Yes HIV screening- Yes  Past Medical History:  Diagnosis Date  . Hx of migraine headaches   . Hypertension      Past Surgical History:  Procedure Laterality Date  . ENDOMETRIAL ABLATION    . FOOT SURGERY    . TUBAL LIGATION      Medications  Takes no meds routinely.  Allergies No Known Allergies  Review of Systems: Constitutional:  no unexpected weight changes Eye:  no recent significant change in vision Ear/Nose/Mouth/Throat:  Ears:  no tinnitus or vertigo and no recent change in hearing Nose/Mouth/Throat:  no complaints of nasal congestion, no sore throat Cardiovascular: no chest pain Respiratory:  no cough and no shortness of breath Gastrointestinal:  no abdominal pain, no change in bowel habits GU:  Female: negative for dysuria or pelvic pain Musculoskeletal/Extremities:  no pain of the joints Integumentary (Skin/Breast):  no abnormal skin lesions reported Neurologic:  no headaches Endocrine:  denies fatigue Hematologic/Lymphatic:  No areas of easy bleeding  Exam BP 112/72 (BP Location: Left Arm, Patient Position: Sitting, Cuff Size: Normal)   Pulse 87   Temp 98.5 F (36.9 C) (Oral)   Ht 5\' 3"  (1.6 m)   Wt 143 lb 4 oz (65 kg)   SpO2 99%   BMI 25.38 kg/m  General:  well developed, well nourished, in no apparent distress Skin:  no significant moles, warts, or growths Head:  no masses, lesions, or tenderness Eyes:  pupils equal and round, sclera anicteric without injection Ears:  canals without lesions, TMs shiny without  retraction, no obvious effusion, no erythema Nose:  nares patent, septum midline, mucosa normal, and no drainage or sinus tenderness Throat/Pharynx:  lips and gingiva without lesion; tongue and uvula midline; non-inflamed pharynx; no exudates or postnasal drainage Neck: neck supple without adenopathy, thyromegaly, or masses Lungs:  clear to auscultation, breath sounds equal bilaterally, no respiratory distress Cardio:  regular rate and rhythm, no bruits, no LE edema Abdomen:  abdomen soft, nontender; bowel sounds normal; no masses or organomegaly Genital: Defer to GYN Musculoskeletal:  symmetrical muscle groups noted without atrophy or deformity Extremities:  no clubbing, cyanosis, or edema, no deformities, no skin discoloration Neuro:  gait normal; deep tendon reflexes normal and symmetric Psych: well oriented with normal range of affect and appropriate judgment/insight  Assessment and Plan  Well adult exam - Plan: Comprehensive metabolic panel, Lipid panel   Well 47 y.o. female. Counseled on diet and exercise. Try to stop ARB. Ck bp at home. If elevated, will need to go back on it.  Other orders as above. Follow up in 1 yr for CPE. The patient voiced understanding and agreement to the plan.  [place], DO 05/29/18 1:56 PM

## 2018-05-29 NOTE — Progress Notes (Signed)
Pre visit review using our clinic review tool, if applicable. No additional management support is needed unless otherwise documented below in the visit note. 

## 2018-05-30 ENCOUNTER — Telehealth: Payer: Self-pay | Admitting: Family Medicine

## 2018-05-30 DIAGNOSIS — Z1239 Encounter for other screening for malignant neoplasm of breast: Secondary | ICD-10-CM

## 2018-05-30 NOTE — Telephone Encounter (Signed)
Done

## 2018-05-30 NOTE — Telephone Encounter (Signed)
Copied from [place] [id number]. Topic: Referral - Request for Referral >> May 30, 2018  9:55 AM Scherrie Gerlach wrote: Has patient seen PCP for this complaint? yes Pt seen Dr Nani Ravens yesterday and mentioned it is time for her to get a mammogram, due to 2 abnormal mammograms in the past. Pt would like referral from Dr Nani Ravens. And OK for her to [facility] the [facility] if you [facility] there. Pt used to go to [facility] for her mammograms.

## 2018-05-31 ENCOUNTER — Telehealth: Payer: Self-pay | Admitting: *Deleted

## 2018-05-31 ENCOUNTER — Other Ambulatory Visit (INDEPENDENT_AMBULATORY_CARE_PROVIDER_SITE_OTHER): Payer: BLUE CROSS/BLUE SHIELD

## 2018-05-31 ENCOUNTER — Other Ambulatory Visit: Payer: Self-pay | Admitting: Family Medicine

## 2018-05-31 DIAGNOSIS — Z0000 Encounter for general adult medical examination without abnormal findings: Secondary | ICD-10-CM

## 2018-05-31 DIAGNOSIS — E875 Hyperkalemia: Secondary | ICD-10-CM

## 2018-05-31 DIAGNOSIS — Z124 Encounter for screening for malignant neoplasm of cervix: Secondary | ICD-10-CM

## 2018-05-31 LAB — COMPREHENSIVE METABOLIC PANEL
ALT: 9 U/L (ref 0–35)
AST: 14 U/L (ref 0–37)
Albumin: 4.7 g/dL (ref 3.5–5.2)
Alkaline Phosphatase: 53 U/L (ref 39–117)
BUN: 11 mg/dL (ref 6–23)
CO2: 25 mEq/L (ref 19–32)
Calcium: 9.6 mg/dL (ref 8.4–10.5)
Chloride: 107 mEq/L (ref 96–112)
Creatinine, Ser: 0.68 mg/dL (ref 0.40–1.20)
GFR: 119.32 mL/min (ref >60.00)
Glucose, Bld: 91 mg/dL (ref 70–99)
Potassium: 5.4 mEq/L — ABNORMAL HIGH (ref 3.5–5.1)
Sodium: 140 mEq/L (ref 135–145)
Total Bilirubin: 0.3 mg/dL (ref 0.2–1.2)
Total Protein: 7.3 g/dL (ref 6.0–8.3)

## 2018-05-31 LAB — LIPID PANEL
Cholesterol: 170 mg/dL (ref 0–200)
HDL: 60.8 mg/dL (ref >39.00)
LDL Cholesterol: 90 mg/dL (ref 0–99)
NonHDL: 109.49
Total CHOL/HDL Ratio: 3
Triglycerides: 95 mg/dL (ref 0.0–149.0)
VLDL: 19 mg/dL (ref 0.0–40.0)

## 2018-05-31 NOTE — Telephone Encounter (Signed)
Notified pt per verbal from PCP that this is not a test he recommends unless pt has had a personal history of cancer and that she should discuss further concerns with GYN. Pt states previous PCP was her GYN. Offered to refer pt to GYN and she is agreeable but prefers a female. Advised her we would refer to Dr Ihor Dow in this building and if she has not been contacted about an appointment within 1 week to call and let us know. Pt voices understanding.

## 2018-05-31 NOTE — Telephone Encounter (Signed)
Agree 

## 2018-05-31 NOTE — Telephone Encounter (Signed)
Pt in lab this morning for blood work and asks if Dr Nani Ravens can order a CA-125. States her previous PCP ordered this once a year due to pt's family history (mom had cervical cancer, father had colon cancer.  I will draw extra tube and hold for your response.  Please advise?

## 2018-06-03 ENCOUNTER — Other Ambulatory Visit (INDEPENDENT_AMBULATORY_CARE_PROVIDER_SITE_OTHER): Payer: BLUE CROSS/BLUE SHIELD

## 2018-06-03 DIAGNOSIS — E875 Hyperkalemia: Secondary | ICD-10-CM | POA: Diagnosis not present

## 2018-06-03 LAB — BASIC METABOLIC PANEL
BUN: 10 mg/dL (ref 6–23)
CO2: 26 mEq/L (ref 19–32)
Calcium: 9.3 mg/dL (ref 8.4–10.5)
Chloride: 101 mEq/L (ref 96–112)
Creatinine, Ser: 0.7 mg/dL (ref 0.40–1.20)
GFR: 115.39 mL/min (ref >60.00)
Glucose, Bld: 95 mg/dL (ref 70–99)
Potassium: 3.9 mEq/L (ref 3.5–5.1)
Sodium: 135 mEq/L (ref 135–145)

## 2018-06-04 ENCOUNTER — Telehealth: Payer: Self-pay

## 2018-06-04 NOTE — Telephone Encounter (Signed)
Please advise 

## 2018-06-04 NOTE — Telephone Encounter (Signed)
OK 

## 2018-06-04 NOTE — Telephone Encounter (Signed)
Ok with me 

## 2018-06-04 NOTE — Telephone Encounter (Signed)
Copied from [employer] [id number]. Topic: Appointment Scheduling - Transfer of Care >> Jun 04, 2018  9:36 AM Bea Graff, NT wrote: Pt is requesting to transfer FROM: Dr. Nani Ravens Pt is requesting to transfer TO: Dr. Bryan Lemma or Wilfred Lacy Reason for requested transfer: Pt not happy with the provider she saw at [facility] and prefers female.   Send CRM to patient's current PCP (transferring FROM).

## 2018-06-05 NOTE — Telephone Encounter (Signed)
Also ok with me 

## 2018-06-06 NOTE — Telephone Encounter (Signed)
I called and spoke to patient. Patient informed TOC approved. Patient scheduled to see Wilfred Lacy on 08/16/18 @ 3:30pm.

## 2018-06-10 DIAGNOSIS — R928 Other abnormal and inconclusive findings on diagnostic imaging of breast: Secondary | ICD-10-CM | POA: Diagnosis not present

## 2018-06-10 DIAGNOSIS — R922 Inconclusive mammogram: Secondary | ICD-10-CM | POA: Diagnosis not present

## 2018-06-10 DIAGNOSIS — Z9889 Other specified postprocedural states: Secondary | ICD-10-CM | POA: Diagnosis not present

## 2018-06-12 ENCOUNTER — Encounter (HOSPITAL_BASED_OUTPATIENT_CLINIC_OR_DEPARTMENT_OTHER): Payer: Self-pay | Admitting: Adult Health

## 2018-06-12 ENCOUNTER — Other Ambulatory Visit: Payer: Self-pay

## 2018-06-12 ENCOUNTER — Emergency Department (HOSPITAL_BASED_OUTPATIENT_CLINIC_OR_DEPARTMENT_OTHER)
Admission: EM | Admit: 2018-06-12 | Discharge: 2018-06-12 | Disposition: A | Discharge status: Home / Self Care | Payer: BLUE CROSS/BLUE SHIELD | Attending: Emergency Medicine | Admitting: Emergency Medicine

## 2018-06-12 DIAGNOSIS — N898 Other specified noninflammatory disorders of vagina: Secondary | ICD-10-CM | POA: Diagnosis not present

## 2018-06-12 DIAGNOSIS — N899 Noninflammatory disorder of vagina, unspecified: Secondary | ICD-10-CM | POA: Diagnosis not present

## 2018-06-12 DIAGNOSIS — F17210 Nicotine dependence, cigarettes, uncomplicated: Secondary | ICD-10-CM | POA: Diagnosis not present

## 2018-06-12 DIAGNOSIS — R102 Pelvic and perineal pain: Secondary | ICD-10-CM | POA: Diagnosis not present

## 2018-06-12 DIAGNOSIS — I10 Essential (primary) hypertension: Secondary | ICD-10-CM | POA: Insufficient documentation

## 2018-06-12 MED ORDER — ACYCLOVIR 400 MG PO TABS: 400.0000 mg | ORAL_TABLET | Freq: Three times a day (TID) | ORAL | 0 refills | Status: AC

## 2018-06-12 MED ORDER — HYDROCODONE-ACETAMINOPHEN 5-325 MG PO TABS: 1.0000 | ORAL_TABLET | Freq: Three times a day (TID) | ORAL | 0 refills | Status: DC | PRN

## 2018-06-12 MED ORDER — CEPHALEXIN 500 MG PO CAPS: 500.0000 mg | ORAL_CAPSULE | Freq: Two times a day (BID) | ORAL | 0 refills | Status: AC

## 2018-06-12 NOTE — ED Triage Notes (Addendum)
Presents with one week of redness and pain to groin at panty line near outer labia lips and labial folds. She reports that the pain is severe and hurts with walking or movement. Denies vaginal discharge. Denies injury. Denies new soaps, or use new lubricants. The rash appeared after sexual intercourse with her husband but does not go into her vagina or into her labia.

## 2018-06-12 NOTE — Discharge Instructions (Signed)
Please take medications as prescribed.   Your test results will be available in 3-5 days.  We will contact you if any of the results are positive.  Please follow-up with your regular doctor next week for reevaluation and return to the emergency department if your symptoms worsen or if any new develop.

## 2018-06-12 NOTE — ED Provider Notes (Signed)
[facility] EMERGENCY DEPARTMENT Provider Note   CSN: [id number] Arrival date & time: 06/12/18  1805     History   Chief Complaint Chief Complaint  Patient presents with  . Rash    HPI Emily Baker is a 47 y.o. female.  HPI  Patient is a 47 year old female with history of hypertension who presents the emergency department today for evaluation of a labial lesion that has been present for the last week.  Reports irritation and pain to the area.  States pain is constant and worse with palpation and irritation from her clothing.  Has had no drainage from the area.  No fevers or chills.  Has never had similar symptoms in the past.  States that she shaves the groin area but has not done so for about a month.  Denies use of any hair removal creams.  Denies any vaginal discharge, vaginal bleeding, urinary symptoms, nausea, vomiting, abdominal pain.  She denies concern for STD and states that she is monogamous with her husband.  No new soaps lubricants or detergents.    Past Medical History:  Diagnosis Date  . Hx of migraine headaches   . Hypertension     Patient Active Problem List   Diagnosis Date Noted  . Bilateral low back pain without sciatica 06/07/2015  . Chronic fatigue 11/04/2014  . Tobacco abuse disorder 11/04/2014  . Benign essential HTN 11/18/2013  . Headache, migraine 11/18/2013    Past Surgical History:  Procedure Laterality Date  . ENDOMETRIAL ABLATION    . FOOT SURGERY    . TUBAL LIGATION       OB History   No obstetric history on file.      Home Medications    Prior to Admission medications   Medication Sig Start Date End Date Taking? Authorizing Provider  olmesartan (BENICAR) 20 MG tablet TAKE 1 TABLET BY MOUTH EVERY DAY 09/06/17  Yes [provider]  acyclovir (ZOVIRAX) 400 MG tablet Take 1 tablet (400 mg total) by mouth every 8 (eight) hours for 7 days. 06/12/18 06/19/18  Antigone Crowell S, PA-C  cephALEXin (KEFLEX) 500 MG  capsule Take 1 capsule (500 mg total) by mouth 2 (two) times daily for 7 days. 06/12/18 06/19/18  Rhona Fusilier S, PA-C  HYDROcodone-acetaminophen (NORCO/VICODIN) 5-325 MG tablet Take 1 tablet by mouth every 8 (eight) hours as needed. 06/12/18   Rodell Marrs S, PA-C    Family History Family History  Problem Relation Age of Onset  . Healthy Child        x 4  . Hypertension Mother   . Hypertension Maternal Grandfather   . Colon cancer Maternal Grandfather 56  . Diabetes Mellitus II Paternal Grandmother   . Hyperlipidemia Brother   . Hypertension Brother   . Hyperlipidemia Brother     Social History Social History   Tobacco Use  . Smoking status: Current Every Day Smoker    Packs/day: 0.50  . Smokeless tobacco: Never Used  Substance Use Topics  . Alcohol use: Yes    Alcohol/week: 0.0 standard drinks    Comment: occasional  . Drug use: No     Allergies   Patient has no known allergies.   Review of Systems Review of Systems  Constitutional: Negative for chills and fever.  HENT: Negative for ear pain and sore throat.   Eyes: Negative for pain and visual disturbance.  Respiratory: Negative for cough and shortness of breath.   Cardiovascular: Negative for chest pain.  Gastrointestinal: Negative  for abdominal pain and vomiting.  Genitourinary: Positive for vaginal pain. Negative for decreased urine volume, dysuria, hematuria, vaginal bleeding and vaginal discharge.       Vaginal lesion  Musculoskeletal: Negative for arthralgias and back pain.  Skin: Negative for color change and rash.  Neurological: Negative for headaches.  All other systems reviewed and are negative.    Physical Exam Updated Vital Signs BP (!) 128/98   Pulse 86   Temp 98.8 F (37.1 C) (Oral)   Resp 18   Ht 5\' 3"  (1.6 m)   Wt 63 kg   SpO2 97%   BMI 24.62 kg/m   Physical Exam Vitals signs and nursing note reviewed.  Constitutional:      General: She is not in acute distress.     Appearance: She is well-developed.  HENT:     Head: Normocephalic and atraumatic.  Eyes:     Conjunctiva/sclera: Conjunctivae normal.  Neck:     Musculoskeletal: Neck supple.  Cardiovascular:     Rate and Rhythm: Normal rate and regular rhythm.     Heart sounds: Normal heart sounds. No murmur.  Pulmonary:     Effort: Pulmonary effort is normal. No respiratory distress.     Breath sounds: Normal breath sounds.  Abdominal:     General: Bowel sounds are normal.     Palpations: Abdomen is soft.     Tenderness: There is no abdominal tenderness.  Genitourinary:    Comments: Chaperone present during external vaginal examination.  There is a superficial lesion to the left lower labial area that is painful to palpation.  There are no vesicular lesions.  The lesion is ill-defined.  There is no significant drainage from the area.  Patient defers pelvic exam. Skin:    General: Skin is warm and dry.  Neurological:     Mental Status: She is alert.    ED Treatments / Results  Labs (all labs ordered are listed, but only abnormal results are displayed) Labs Reviewed  HSV CULTURE AND TYPING  RPR    EKG None  Radiology No results found.  Procedures Procedures (including critical care time)  Medications Ordered in ED Medications - No data to display   Initial Impression / Assessment and Plan / ED Course  I have reviewed the triage vital signs and the nursing notes.  Pertinent labs & imaging results that were available during my care of the patient were reviewed by me and considered in my medical decision making (see chart for details).  Final Clinical Impressions(s) / ED Diagnoses   Final diagnoses:  Vaginal lesion   Patient presenting with vaginal lesion that has been present for the last week.  She has a superficial lesion on exam.  She defers pelvic exam.  She has low suspicion for STD given that she is in a monogamous relationship with her husband.  She has no known history of  herpes infection.  Denies any recent use of hair removal cream.  Denies any recent shaving but does report that she has in the past.  The lesion was swabbed for HSV and patient will be given acyclovir for possible HSV.  To concern for possible superinfection, patient will be also started on Keflex.  She was given pain medication for her symptoms.  Advised warm soaks at home.  Advise close follow-up with her PCP.  Also obtained RPR testing and advised her that results will return in the next several days.  Have lower suspicion for syphilis at this time  given that the lesion is painful and patient is low risk for STD per history.  Advise return to the ER for new or worsening symptoms in the meantime.  She voiced understanding the plan reasons return.  All questions answered.  ED Discharge Orders         Ordered    cephALEXin (KEFLEX) 500 MG capsule  2 times daily     06/12/18 2023    acyclovir (ZOVIRAX) 400 MG tablet  Every 8 hours     06/12/18 2023    HYDROcodone-acetaminophen (NORCO/VICODIN) 5-325 MG tablet  Every 8 hours PRN     06/12/18 2023           Rodney Booze, PA-C 06/12/18 2038    Tegeler, Gwenyth Allegra, MD 06/12/18 2316

## 2018-06-12 NOTE — ED Notes (Signed)
ED Provider at bedside. 

## 2018-06-14 ENCOUNTER — Inpatient Hospital Stay: Payer: BLUE CROSS/BLUE SHIELD | Admitting: Nurse Practitioner

## 2018-06-14 LAB — RPR: RPR Ser Ql: NONREACTIVE

## 2018-06-16 LAB — HSV CULTURE AND TYPING

## 2018-06-21 ENCOUNTER — Ambulatory Visit: Payer: 59 | Admitting: Nurse Practitioner

## 2018-06-21 ENCOUNTER — Other Ambulatory Visit (HOSPITAL_COMMUNITY)
Admission: RE | Admit: 2018-06-21 | Discharge: 2018-06-21 | Disposition: A | Discharge status: Home / Self Care | Payer: 59 | Source: Ambulatory Visit | Attending: Nurse Practitioner | Admitting: Nurse Practitioner

## 2018-06-21 ENCOUNTER — Encounter: Payer: Self-pay | Admitting: Nurse Practitioner

## 2018-06-21 VITALS — BP 138/86 | HR 83 | Temp 98.1°F | Ht 63.0 in | Wt 140.4 lb

## 2018-06-21 DIAGNOSIS — A6004 Herpesviral vulvovaginitis: Secondary | ICD-10-CM

## 2018-06-21 DIAGNOSIS — N898 Other specified noninflammatory disorders of vagina: Secondary | ICD-10-CM | POA: Insufficient documentation

## 2018-06-21 MED ORDER — VALACYCLOVIR HCL 1 G PO TABS: 1000.0000 mg | ORAL_TABLET | Freq: Two times a day (BID) | ORAL | 0 refills | Status: DC

## 2018-06-21 MED ORDER — LIDOCAINE HCL 4 % EX GEL: 1.0000 [in_us] | Freq: Four times a day (QID) | CUTANEOUS | 1 refills | Status: DC | PRN

## 2018-06-21 NOTE — Patient Instructions (Signed)
Maintain upcoming appt with me.  Genital Herpes Genital herpes is a common sexually transmitted infection (STI) that is caused by a virus. The virus spreads from person to person through sexual contact. Infection can cause itching, blisters, and sores around the genitals or rectum. Symptoms may last several days and then go away This is called an outbreak. However, the virus remains in your body, so you may have more outbreaks in the future. The time between outbreaks varies and can be months or years. Genital herpes affects men and women. It is particularly concerning for pregnant women because the virus can be passed to the baby during delivery and can cause serious problems. Genital herpes is also a concern for people who have a weak disease-fighting (immune) system. What are the causes? This condition is caused by the herpes simplex virus (HSV) type 1 or type 2. The virus may spread through:  Sexual contact with an infected person, including vaginal, anal, and oral sex.  Contact with fluid from a herpes sore.  The skin. This means that you can get herpes from an infected partner even if he or she does not have a visible sore or does not know that he or she is infected. What increases the risk? You are more likely to develop this condition if:  You have sex with many partners.  You do not use latex condoms during sex. What are the signs or symptoms? Most people do not have symptoms (asymptomatic) or have mild symptoms that may be mistaken for other skin problems. Symptoms may include:  Small red bumps near the genitals, rectum, or mouth. These bumps turn into blisters and then turn into sores.  Flu-like symptoms, including: ? Fever. ? Body aches. ? Swollen lymph nodes. ? Headache.  Painful urination.  Pain and itching in the genital area or rectal area.  Vaginal discharge.  Tingling or shooting pain in the legs and buttocks. Generally, symptoms are more severe and last longer  during the first (primary) outbreak. Flu-like symptoms are also more common during the primary outbreak. How is this diagnosed? Genital herpes may be diagnosed based on:  A physical exam.  Your medical history.  Blood tests.  A test of a fluid sample (culture) from an open sore. How is this treated? There is no cure for this condition, but treatment with antiviral medicines that are taken by mouth (orally) can do the following:  Speed up healing and relieve symptoms.  Help to reduce the spread of the virus to sexual partners.  Limit the chance of future outbreaks, or make future outbreaks shorter.  Lessen symptoms of future outbreaks. Your health care provider may also recommend pain relief medicines, such as aspirin or ibuprofen. Follow these instructions at home: Sexual activity  Do not have sexual contact during active outbreaks.  Practice safe sex. Latex condoms and female condoms may help prevent the spread of the herpes virus. General instructions  Keep the affected areas dry and clean.  Take over-the-counter and prescription medicines only as told by your health care provider.  Avoid rubbing or touching blisters and sores. If you do touch blisters or sores: ? Wash your hands thoroughly with soap and water. ? Do not touch your eyes afterward.  To help relieve pain or itching, you may take the following actions as directed by your health care provider: ? Apply a cold, wet cloth (cold compress) to affected areas 4-6 times a day. ? Apply a substance that protects your skin and reduces bleeding (  astringent). ? Apply a gel that helps relieve pain around sores (lidocaine gel). ? Take a warm, shallow bath that cleans the genital area (sitz bath).  Keep all follow-up visits as told by your health care provider. This is important. How is this prevented?  Use condoms. Although anyone can get genital herpes during sexual contact, even with the use of a condom, a condom can  provide some protection.  Avoid having multiple sexual partners.  Talk with your sexual partner about any symptoms either of you may have. Also, talk with your partner about any history of STIs.  Get tested for STIs before you have sex. Ask your partner to do the same.  Do not have sexual contact if you have symptoms of genital herpes. Contact a health care provider if:  Your symptoms are not improving with medicine.  Your symptoms return.  You have new symptoms.  You have a fever.  You have abdominal pain.  You have redness, swelling, or pain in your eye.  You notice new sores on other parts of your body.  You are a woman and experience bleeding between menstrual periods.  You have had herpes and you become pregnant or plan to become pregnant. Summary  Genital herpes is a common sexually transmitted infection (STI) that is caused by the herpes simplex virus (HSV) type 1 or type 2.  These viruses are most often spread through sexual contact with an infected person.  You are more likely to develop this condition if you have sex with many partners or you have unprotected sex.  Most people do not have symptoms (asymptomatic) or have mild symptoms that may be mistaken for other skin problems. Symptoms occur as outbreaks that may happen months or years apart.  There is no cure for this condition, but treatment with oral antiviral medicines can reduce symptoms, reduce the chance of spreading the virus to a partner, prevent future outbreaks, or shorten future outbreaks. This information is not intended to replace advice given to you by your health care provider. Make sure you discuss any questions you have with your health care provider. Document Released: 06/02/2000 Document Revised: 05/05/2016 Document Reviewed: 05/05/2016 Elsevier Interactive Patient Education  2019 [employer].

## 2018-06-21 NOTE — Progress Notes (Signed)
Subjective:  Patient ID: Emily Baker, female    DOB: 1971/05/30  Age: 48 y.o. MRN: [id number]  CC: Hospitalization Follow-up (vaginal irritation f/u/ still has irritation, burning)  Vaginal Discharge  The patient's primary symptoms include genital itching, genital lesions, a genital rash and vaginal discharge. The patient's pertinent negatives include no genital odor, missed menses, pelvic pain or vaginal bleeding. This is a new problem. The current episode started in the past 7 days. The problem occurs constantly. The pain is mild. The problem affects the right side. She is not pregnant.  completed acyclovir x 7days (400mg  TID). Married, sexually active with husband only, husband is asymptomatic  Reviewed past Medical, Social and Family history today.  Outpatient Medications Prior to Visit  Medication Sig Dispense Refill  . olmesartan (BENICAR) 20 MG tablet TAKE 1 TABLET BY MOUTH EVERY DAY    . HYDROcodone-acetaminophen (NORCO/VICODIN) 5-325 MG tablet Take 1 tablet by mouth every 8 (eight) hours as needed. (Patient not taking: Reported on 06/21/2018) 6 tablet 0   No facility-administered medications prior to visit.     ROS See HPI  Objective:  BP 138/86   Pulse 83   Temp 98.1 F (36.7 C) (Oral)   Ht 5\' 3"  (1.6 m)   Wt 140 lb 6.4 oz (63.7 kg)   SpO2 100%   BMI 24.87 kg/m   BP Readings from Last 3 Encounters:  06/21/18 138/86  06/12/18 124/80  05/29/18 112/72    Wt Readings from Last 3 Encounters:  06/21/18 140 lb 6.4 oz (63.7 kg)  06/12/18 139 lb (63 kg)  05/29/18 143 lb 4 oz (65 kg)    Physical Exam Vitals signs reviewed. Exam conducted with a chaperone present.  Genitourinary:    Labia:        Right: Tenderness present.      Vagina: Vaginal discharge and tenderness present. No erythema or bleeding.     Cervix: Discharge present. No cervical motion tenderness, friability, lesion or erythema.     Uterus: Normal.        Lab Results  Component Value  Date   WBC 7.2 01/05/2015   HGB 14.6 01/05/2015   HCT 43.0 01/05/2015   PLT 298.0 01/05/2015   GLUCOSE 95 06/03/2018   CHOL 170 05/31/2018   TRIG 95.0 05/31/2018   HDL 60.80 05/31/2018   LDLCALC 90 05/31/2018   ALT 9 05/31/2018   AST 14 05/31/2018   NA 135 06/03/2018   K 3.9 06/03/2018   CL 101 06/03/2018   CREATININE 0.70 06/03/2018   BUN 10 06/03/2018   CO2 26 06/03/2018   TSH 0.56 01/05/2015    Assessment & Plan:   Emily Baker was seen today for hospitalization follow-up.  Diagnoses and all orders for this visit:  Vaginal discharge -     Cervicovaginal ancillary only( [place])  Herpes simplex vulvovaginitis -     valACYclovir (VALTREX) 1000 MG tablet; Take 1 tablet (1,000 mg total) by mouth 2 (two) times daily. -     Lidocaine HCl 4 % GEL; Apply 1 inch topically every 6 (six) hours as needed.   I am having Emily Baker start on valACYclovir and Lidocaine HCl. I am also having her maintain her olmesartan and HYDROcodone-acetaminophen.  Meds ordered this encounter  Medications  . valACYclovir (VALTREX) 1000 MG tablet    Sig: Take 1 tablet (1,000 mg total) by mouth 2 (two) times daily.    Dispense:  20 tablet    Refill:  0  Order Specific Question:   Supervising Provider    Answer:   Lucille Passy [[id number]]  . Lidocaine HCl 4 % GEL    Sig: Apply 1 inch topically every 6 (six) hours as needed.    Dispense:  30 mL    Refill:  1    Order Specific Question:   Supervising Provider    Answer:   Lucille Passy [[id number]]    Problem List Items Addressed This Visit    None    Visit Diagnoses    Vaginal discharge    -  Primary   Relevant Orders   Cervicovaginal ancillary only( [place])   Herpes simplex vulvovaginitis       Relevant Medications   valACYclovir (VALTREX) 1000 MG tablet   Lidocaine HCl 4 % GEL       Follow-up: No follow-ups on file.  Wilfred Lacy, NP

## 2018-06-22 ENCOUNTER — Encounter: Payer: Self-pay | Admitting: Nurse Practitioner

## 2018-06-22 DIAGNOSIS — A6004 Herpesviral vulvovaginitis: Secondary | ICD-10-CM

## 2018-06-24 LAB — CERVICOVAGINAL ANCILLARY ONLY
Bacterial vaginitis: NEGATIVE
Candida vaginitis: NEGATIVE
Chlamydia: NEGATIVE
Neisseria Gonorrhea: NEGATIVE
Trichomonas: NEGATIVE

## 2018-06-24 MED ORDER — LIDOCAINE 5 % EX OINT: 1.0000 "application " | TOPICAL_OINTMENT | Freq: Three times a day (TID) | CUTANEOUS | 0 refills | Status: DC | PRN

## 2018-06-24 NOTE — Telephone Encounter (Signed)
Received a message from pharmacy stating insurance rejected Lidocaine 5%, they request Lidocaine 3% cream sent in. Please advise.

## 2018-06-24 NOTE — Telephone Encounter (Signed)
Pharmacy stating they have 5% ointment. Please resend.

## 2018-06-25 ENCOUNTER — Telehealth: Payer: Self-pay | Admitting: Nurse Practitioner

## 2018-06-25 DIAGNOSIS — A6004 Herpesviral vulvovaginitis: Secondary | ICD-10-CM

## 2018-06-25 MED ORDER — LIDOCAINE 3.75 % EX CREA: 1.0000 [in_us] | TOPICAL_CREAM | Freq: Three times a day (TID) | CUTANEOUS | 0 refills | Status: DC | PRN

## 2018-06-25 NOTE — Telephone Encounter (Signed)
Pt is aware that we sent in this rx and to pick up otc cream if insurance is not cover.

## 2018-06-25 NOTE — Telephone Encounter (Signed)
See phone note

## 2018-07-01 ENCOUNTER — Encounter: Payer: Self-pay | Admitting: Nurse Practitioner

## 2018-07-01 DIAGNOSIS — A6004 Herpesviral vulvovaginitis: Secondary | ICD-10-CM

## 2018-07-01 MED ORDER — VALACYCLOVIR HCL 1 G PO TABS: 1000.0000 mg | ORAL_TABLET | Freq: Two times a day (BID) | ORAL | 0 refills | Status: DC

## 2018-07-24 ENCOUNTER — Encounter: Payer: 59 | Admitting: Obstetrics & Gynecology

## 2018-07-25 ENCOUNTER — Telehealth: Payer: Commercial Managed Care - PPO | Admitting: Physician Assistant

## 2018-07-25 DIAGNOSIS — R102 Pelvic and perineal pain unspecified side: Secondary | ICD-10-CM

## 2018-07-25 NOTE — Progress Notes (Signed)
Based on what you shared with me it looks like you have a condition that should be evaluated in a face to face office visit. We do nit treat vaginal pain from e visit. You will need to speak to your primary provider or be seen at an urgent care so the proper diagnosis can be made and treatment be given. NOTE: If you entered your credit card information for this eVisit, you will not be charged. You may see a "hold" on your card for the $30 but that hold will drop off and you will not have a charge processed.  If you are having a true medical emergency please call 911.  If you need an urgent face to face visit, [place] has four urgent care centers for your convenience.  If you need care fast and have a high deductible or no insurance consider:   [email or web address] to reserve your spot online an avoid wait times  [facility] [address] [place], [place] [address] 8 am to 8 pm Monday-Friday 10 am to 4 pm Saturday-Sunday *Across the street from [employer]  [place], [address] 8 am to 5 pm Monday-Friday * In the [facility] on the [facility]   The following sites will take your  insurance:  . [facility] Urgent [place] a Provider at this Location  [address] [place], [place] [address] . 10 am to 8 pm Monday-Friday . 12 pm to 8 pm Saturday-Sunday   . [facility] at [place] a Provider at this Location  [address] [place], [place], [place] [address] . 8 am to 8 pm Monday-Friday . 9 am to 6 pm Saturday . 11 am to 6 pm Sunday   . [facility] Urgent Care at [place] Get Driving Directions  [address] [place],  [address] . 8 am to 8 pm Monday-Friday . 8 am to 4 pm Saturday-Sunday   Your e-visit answers were reviewed by  a board certified advanced clinical practitioner to complete your personal care plan.  Thank you for using e-Visits.

## 2018-07-26 ENCOUNTER — Encounter: Payer: Self-pay | Admitting: Nurse Practitioner

## 2018-07-26 DIAGNOSIS — A6004 Herpesviral vulvovaginitis: Secondary | ICD-10-CM

## 2018-07-26 MED ORDER — VALACYCLOVIR HCL 500 MG PO TABS: 500.0000 mg | ORAL_TABLET | Freq: Every day | ORAL | 1 refills | Status: DC

## 2018-08-14 ENCOUNTER — Encounter: Payer: Self-pay | Admitting: Obstetrics & Gynecology

## 2018-08-14 ENCOUNTER — Ambulatory Visit (INDEPENDENT_AMBULATORY_CARE_PROVIDER_SITE_OTHER): Payer: 59 | Admitting: Obstetrics & Gynecology

## 2018-08-14 VITALS — BP 131/91 | HR 82 | Resp 16 | Ht 63.0 in | Wt 141.0 lb

## 2018-08-14 DIAGNOSIS — A6004 Herpesviral vulvovaginitis: Secondary | ICD-10-CM

## 2018-08-14 DIAGNOSIS — Z1151 Encounter for screening for human papillomavirus (HPV): Secondary | ICD-10-CM | POA: Diagnosis not present

## 2018-08-14 DIAGNOSIS — Z01419 Encounter for gynecological examination (general) (routine) without abnormal findings: Secondary | ICD-10-CM

## 2018-08-14 DIAGNOSIS — Z124 Encounter for screening for malignant neoplasm of cervix: Secondary | ICD-10-CM

## 2018-08-14 DIAGNOSIS — Z720 Tobacco use: Secondary | ICD-10-CM

## 2018-08-14 DIAGNOSIS — A6000 Herpesviral infection of urogenital system, unspecified: Secondary | ICD-10-CM | POA: Insufficient documentation

## 2018-08-14 MED ORDER — VALACYCLOVIR HCL 500 MG PO TABS: 500.0000 mg | ORAL_TABLET | Freq: Every day | ORAL | 4 refills | Status: DC

## 2018-08-14 NOTE — Patient Instructions (Signed)
Genital Herpes °Genital herpes is a common sexually transmitted infection (STI) that is caused by a virus. The virus spreads from person to person through sexual contact. Infection can cause itching, blisters, and sores around the genitals or rectum. Symptoms may last several days and then go away This is called an outbreak. However, the virus remains in your body, so you may have more outbreaks in the future. The time between outbreaks varies and can be months or years. °Genital herpes affects men and women. It is particularly concerning for pregnant women because the virus can be passed to the baby during delivery and can cause serious problems. Genital herpes is also a concern for people who have a weak disease-fighting (immune) system. °What are the causes? °This condition is caused by the herpes simplex virus (HSV) type 1 or type 2. The virus may spread through: °· Sexual contact with an infected person, including vaginal, anal, and oral sex. °· Contact with fluid from a herpes sore. °· The skin. This means that you can get herpes from an infected partner even if he or she does not have a visible sore or does not know that he or she is infected. °What increases the risk? °You are more likely to develop this condition if: °· You have sex with many partners. °· You do not use latex condoms during sex. °What are the signs or symptoms? °Most people do not have symptoms (asymptomatic) or have mild symptoms that may be mistaken for other skin problems. Symptoms may include: °· Small red bumps near the genitals, rectum, or mouth. These bumps turn into blisters and then turn into sores. °· Flu-like symptoms, including: °? Fever. °? Body aches. °? Swollen lymph nodes. °? Headache. °· Painful urination. °· Pain and itching in the genital area or rectal area. °· Vaginal discharge. °· Tingling or shooting pain in the legs and buttocks. °Generally, symptoms are more severe and last longer during the first (primary)  outbreak. Flu-like symptoms are also more common during the primary outbreak. °How is this diagnosed? °Genital herpes may be diagnosed based on: °· A physical exam. °· Your medical history. °· Blood tests. °· A test of a fluid sample (culture) from an open sore. °How is this treated? °There is no cure for this condition, but treatment with antiviral medicines that are taken by mouth (orally) can do the following: °· Speed up healing and relieve symptoms. °· Help to reduce the spread of the virus to sexual partners. °· Limit the chance of future outbreaks, or make future outbreaks shorter. °· Lessen symptoms of future outbreaks. °Your health care provider may also recommend pain relief medicines, such as aspirin or ibuprofen. °Follow these instructions at home: °Sexual activity °· Do not have sexual contact during active outbreaks. °· Practice safe sex. Latex condoms and female condoms may help prevent the spread of the herpes virus. °General instructions °· Keep the affected areas dry and clean. °· Take over-the-counter and prescription medicines only as told by your health care provider. °· Avoid rubbing or touching blisters and sores. If you do touch blisters or sores: °? Wash your hands thoroughly with soap and water. °? Do not touch your eyes afterward. °· To help relieve pain or itching, you may take the following actions as directed by your health care provider: °? Apply a cold, wet cloth (cold compress) to affected areas 4-6 times a day. °? Apply a substance that protects your skin and reduces bleeding (astringent). °? Apply a gel that   helps relieve pain around sores (lidocaine gel). °? Take a warm, shallow bath that cleans the genital area (sitz bath). °· Keep all follow-up visits as told by your health care provider. This is important. °How is this prevented? °· Use condoms. Although anyone can get genital herpes during sexual contact, even with the use of a condom, a condom can provide some  protection. °· Avoid having multiple sexual partners. °· Talk with your sexual partner about any symptoms either of you may have. Also, talk with your partner about any history of STIs. °· Get tested for STIs before you have sex. Ask your partner to do the same. °· Do not have sexual contact if you have symptoms of genital herpes. °Contact a health care provider if: °· Your symptoms are not improving with medicine. °· Your symptoms return. °· You have new symptoms. °· You have a fever. °· You have abdominal pain. °· You have redness, swelling, or pain in your eye. °· You notice new sores on other parts of your body. °· You are a woman and experience bleeding between menstrual periods. °· You have had herpes and you become pregnant or plan to become pregnant. °Summary °· Genital herpes is a common sexually transmitted infection (STI) that is caused by the herpes simplex virus (HSV) type 1 or type 2. °· These viruses are most often spread through sexual contact with an infected person. °· You are more likely to develop this condition if you have sex with many partners or you have unprotected sex. °· Most people do not have symptoms (asymptomatic) or have mild symptoms that may be mistaken for other skin problems. Symptoms occur as outbreaks that may happen months or years apart. °· There is no cure for this condition, but treatment with oral antiviral medicines can reduce symptoms, reduce the chance of spreading the virus to a partner, prevent future outbreaks, or shorten future outbreaks. °This information is not intended to replace advice given to you by your health care provider. Make sure you discuss any questions you have with your health care provider. °Document Released: 06/02/2000 Document Revised: 05/05/2016 Document Reviewed: 05/05/2016 °Elsevier Interactive Patient Education © 2019 [employer] ° °

## 2018-08-14 NOTE — Progress Notes (Signed)
Subjective:     Emily Baker is a 48 y.o. female here for a routine exam. Emily Baker Current complaints: pt was recently dx'd with genital herpes in Jan. Pt was on suppresson.  LMP none since 2009 due to endometrial ablation with Dr. Waylan Rocher. +tob use 1 pack per 2 days.    Gynecologic History No LMP recorded. Patient has had an ablation. Contraception: tubal ligation in 1998 Last Pap: 10/22/2015. Results were: normal Last mammogram: 06/10/2018. Results were: benign  Obstetric History Emily Baker s/p SVD x4  The following portions of the patient's history were reviewed and updated as appropriate: allergies, current medications, past family history, past medical history, past social history, past surgical history and problem list.  Review of Systems Pertinent items are noted in HPI.    Objective:  BP 132/90 (BP Location: Right Arm, Cuff Size: Normal)   Pulse 87   Resp 16   Ht 5\' 3"  (1.6 m)   Wt 141 lb (64 kg)   BMI 24.98 kg/m   General Appearance:    Alert, cooperative, no distress, appears stated age  Head:    Normocephalic, without obvious abnormality, atraumatic  Eyes:    conjunctiva/corneas clear, EOM's intact, both eyes  Ears:    Normal external ear canals, both ears  Nose:   Nares normal, septum midline, mucosa normal, no drainage    or sinus tenderness  Throat:   Lips, mucosa, and tongue normal; teeth and gums normal  Neck:   Supple, symmetrical, trachea midline, no adenopathy;    thyroid:  no enlargement/tenderness/nodules  Back:     Symmetric, no curvature, ROM normal, no CVA tenderness  Lungs:     Clear to auscultation bilaterally, respirations unlabored  Chest Wall:    No tenderness or deformity   Heart:    Regular rate and rhythm, S1 and S2 normal, no murmur, rub   or gallop  Breast Exam:    No tenderness, masses, or nipple abnormality  Abdomen:     Soft, non-tender, bowel sounds active all four quadrants,    no masses, no organomegaly  Genitalia:    Normal female  without lesion, discharge or tenderness     Extremities:   Extremities normal, atraumatic, no cyanosis or edema  Pulses:   2+ and symmetric all extremities  Skin:   Skin color, texture, turgor normal, no rashes or lesions    Assessment:    Healthy female exam.   New dx of HSV Tob use   Plan:   F/u in 1 year Famvir 500mg  po q day F/u PAP with hrHPV rec tob cessation  Pt to recheck her BP at home and record  Emily Baker L. Ihor Dow, M.D., [place] .

## 2018-08-14 NOTE — Progress Notes (Signed)
Patient is c/o establishing care with GYN. Last PAP 10/2015 Normal. Patient reports mom - ovarian cancer - alive; maternal grandfather - colon cancer - deceased(MI). Dx HSV 2 - Valtrex. MBM,CMA

## 2018-08-16 ENCOUNTER — Encounter: Payer: Self-pay | Admitting: Nurse Practitioner

## 2018-08-16 ENCOUNTER — Ambulatory Visit (INDEPENDENT_AMBULATORY_CARE_PROVIDER_SITE_OTHER): Payer: 59 | Admitting: Nurse Practitioner

## 2018-08-16 VITALS — BP 126/88 | HR 71 | Temp 98.3°F | Ht 63.0 in | Wt 141.8 lb

## 2018-08-16 DIAGNOSIS — F419 Anxiety disorder, unspecified: Secondary | ICD-10-CM | POA: Diagnosis not present

## 2018-08-16 DIAGNOSIS — I10 Essential (primary) hypertension: Secondary | ICD-10-CM | POA: Diagnosis not present

## 2018-08-16 MED ORDER — OLMESARTAN MEDOXOMIL 20 MG PO TABS: 20.0000 mg | ORAL_TABLET | Freq: Every day | ORAL | 3 refills | Status: DC

## 2018-08-16 NOTE — Patient Instructions (Addendum)
DASH Eating Plan  DASH stands for "Dietary Approaches to Stop Hypertension." The DASH eating plan is a healthy eating plan that has been shown to reduce high blood pressure (hypertension). It may also reduce your risk for type 2 diabetes, heart disease, and stroke. The DASH eating plan may also help with weight loss.  What are tips for following this plan?    General guidelines   Avoid eating more than 2,300 mg (milligrams) of salt (sodium) a day. If you have hypertension, you may need to reduce your sodium intake to 1,500 mg a day.   Limit alcohol intake to no more than 1 drink a day for nonpregnant women and 2 drinks a day for men. One drink equals 12 oz of beer, 5 oz of wine, or 1 oz of hard liquor.   Work with your health care provider to maintain a healthy body weight or to lose weight. Ask what an ideal weight is for you.   Get at least 30 minutes of exercise that causes your heart to beat faster (aerobic exercise) most days of the week. Activities may include walking, swimming, or biking.   Work with your health care provider or diet and nutrition specialist (dietitian) to adjust your eating plan to your individual calorie needs.  Reading food labels     Check food labels for the amount of sodium per serving. Choose foods with less than 5 percent of the Daily Value of sodium. Generally, foods with less than 300 mg of sodium per serving fit into this eating plan.   To find whole grains, look for the word "whole" as the first word in the ingredient list.  Shopping   Buy products labeled as "low-sodium" or "no salt added."   Buy fresh foods. Avoid canned foods and premade or frozen meals.  Cooking   Avoid adding salt when cooking. Use salt-free seasonings or herbs instead of table salt or sea salt. Check with your health care provider or pharmacist before using salt substitutes.   Do not fry foods. Cook foods using healthy methods such as baking, boiling, grilling, and broiling instead.   Cook with  heart-healthy oils, such as olive, canola, soybean, or sunflower oil.  Meal planning   Eat a balanced diet that includes:  ? 5 or more servings of fruits and vegetables each day. At each meal, try to fill half of your plate with fruits and vegetables.  ? Up to 6-8 servings of whole grains each day.  ? Less than 6 oz of lean meat, poultry, or fish each day. A 3-oz serving of meat is about the same size as a deck of cards. One egg equals 1 oz.  ? 2 servings of low-fat dairy each day.  ? A serving of nuts, seeds, or beans 5 times each week.  ? Heart-healthy fats. Healthy fats called Omega-3 fatty acids are found in foods such as flaxseeds and coldwater fish, like sardines, salmon, and mackerel.   Limit how much you eat of the following:  ? Canned or prepackaged foods.  ? Food that is high in trans fat, such as fried foods.  ? Food that is high in saturated fat, such as fatty meat.  ? Sweets, desserts, sugary drinks, and other foods with added sugar.  ? Full-fat dairy products.   Do not salt foods before eating.   Try to eat at least 2 vegetarian meals each week.   Eat more home-cooked food and less restaurant, buffet, and fast food.     When eating at a restaurant, ask that your food be prepared with less salt or no salt, if possible.  What foods are recommended?  The items listed may not be a complete list. Talk with your dietitian about what dietary choices are best for you.  Grains  Whole-grain or whole-wheat bread. Whole-grain or whole-wheat pasta. Brown rice. Oatmeal. Quinoa. Bulgur. Whole-grain and low-sodium cereals. Pita bread. Low-fat, low-sodium crackers. Whole-wheat flour tortillas.  Vegetables  Fresh or frozen vegetables (raw, steamed, roasted, or grilled). Low-sodium or reduced-sodium tomato and vegetable juice. Low-sodium or reduced-sodium tomato sauce and tomato paste. Low-sodium or reduced-sodium canned vegetables.  Fruits  All fresh, dried, or frozen fruit. Canned fruit in natural juice (without  added sugar).  Meat and other protein foods  Skinless chicken or turkey. Ground chicken or turkey. Pork with fat trimmed off. Fish and seafood. Egg whites. Dried beans, peas, or lentils. Unsalted nuts, nut butters, and seeds. Unsalted canned beans. Lean cuts of beef with fat trimmed off. Low-sodium, lean deli meat.  Dairy  Low-fat (1%) or fat-free (skim) milk. Fat-free, low-fat, or reduced-fat cheeses. Nonfat, low-sodium ricotta or cottage cheese. Low-fat or nonfat yogurt. Low-fat, low-sodium cheese.  Fats and oils  Soft margarine without trans fats. Vegetable oil. Low-fat, reduced-fat, or light mayonnaise and salad dressings (reduced-sodium). Canola, safflower, olive, soybean, and sunflower oils. Avocado.  Seasoning and other foods  Herbs. Spices. Seasoning mixes without salt. Unsalted popcorn and pretzels. Fat-free sweets.  What foods are not recommended?  The items listed may not be a complete list. Talk with your dietitian about what dietary choices are best for you.  Grains  Baked goods made with fat, such as croissants, muffins, or some breads. Dry pasta or rice meal packs.  Vegetables  Creamed or fried vegetables. Vegetables in a cheese sauce. Regular canned vegetables (not low-sodium or reduced-sodium). Regular canned tomato sauce and paste (not low-sodium or reduced-sodium). Regular tomato and vegetable juice (not low-sodium or reduced-sodium). Pickles. Olives.  Fruits  Canned fruit in a light or heavy syrup. Fried fruit. Fruit in cream or butter sauce.  Meat and other protein foods  Fatty cuts of meat. Ribs. Fried meat. Bacon. Sausage. Bologna and other processed lunch meats. Salami. Fatback. Hotdogs. Bratwurst. Salted nuts and seeds. Canned beans with added salt. Canned or smoked fish. Whole eggs or egg yolks. Chicken or turkey with skin.  Dairy  Whole or 2% milk, cream, and half-and-half. Whole or full-fat cream cheese. Whole-fat or sweetened yogurt. Full-fat cheese. Nondairy creamers. Whipped toppings.  Processed cheese and cheese spreads.  Fats and oils  Butter. Stick margarine. Lard. Shortening. Ghee. Bacon fat. Tropical oils, such as coconut, palm kernel, or palm oil.  Seasoning and other foods  Salted popcorn and pretzels. Onion salt, garlic salt, seasoned salt, table salt, and sea salt. Worcestershire sauce. Tartar sauce. Barbecue sauce. Teriyaki sauce. Soy sauce, including reduced-sodium. Steak sauce. Canned and packaged gravies. Fish sauce. Oyster sauce. Cocktail sauce. Horseradish that you find on the shelf. Ketchup. Mustard. Meat flavorings and tenderizers. Bouillon cubes. Hot sauce and Tabasco sauce. Premade or packaged marinades. Premade or packaged taco seasonings. Relishes. Regular salad dressings.  Where to find more information:   National Heart, Lung, and Blood Institute: www.nhlbi.nih.gov   American Heart Association: www.heart.org  Summary   The DASH eating plan is a healthy eating plan that has been shown to reduce high blood pressure (hypertension). It may also reduce your risk for type 2 diabetes, heart disease, and stroke.   With the   DASH eating plan, you should limit salt (sodium) intake to 2,300 mg a day. If you have hypertension, you may need to reduce your sodium intake to 1,500 mg a day.   When on the DASH eating plan, aim to eat more fresh fruits and vegetables, whole grains, lean proteins, low-fat dairy, and heart-healthy fats.   Work with your health care provider or diet and nutrition specialist (dietitian) to adjust your eating plan to your individual calorie needs.  This information is not intended to replace advice given to you by your health care provider. Make sure you discuss any questions you have with your health care provider.  Document Released: 05/25/2011 Document Revised: 05/29/2016 Document Reviewed: 05/29/2016  Elsevier Interactive Patient Education  2019 [employer] Inc[employer]

## 2018-08-16 NOTE — Progress Notes (Signed)
Subjective:  Patient ID: Emily Baker, female    DOB: 10/03/1970  Age: 48 y.o. MRN: [id number]  CC: Establish Care (Transfer care/medications consult. )   Anxiety  Presents for initial visit. Onset was 6 to 12 months ago. The problem has been gradually worsening. Symptoms include excessive worry, muscle tension, nervous/anxious behavior, palpitations and restlessness. Patient reports no decreased concentration, depressed mood, insomnia or suicidal ideas. Symptoms occur most days. The severity of symptoms is interfering with daily activities. The symptoms are aggravated by family issues.   There are no known risk factors. Her past medical history is significant for anxiety/panic attacks. There is no history of anemia, depression, hyperthyroidism or suicide attempts. Past treatments include nothing.    HTN: Chronic  Controlled with benicar.  Tobacco use: 1/4ppd >15yrs. Working on weaning off.  Reviewed past Medical, Social and Family history today.  Outpatient Medications Prior to Visit  Medication Sig Dispense Refill  . valACYclovir (VALTREX) 500 MG tablet Take 1 tablet (500 mg total) by mouth daily. 90 tablet 4  . olmesartan (BENICAR) 20 MG tablet TAKE 1 TABLET BY MOUTH EVERY DAY     No facility-administered medications prior to visit.     ROS See HPI  Objective:  BP 126/88   Pulse 71   Temp 98.3 F (36.8 C) (Oral)   Ht 5\' 3"  (1.6 m)   Wt 141 lb 12.8 oz (64.3 kg)   SpO2 100%   BMI 25.12 kg/m   BP Readings from Last 3 Encounters:  08/16/18 126/88  08/14/18 (!) 131/91  06/21/18 138/86    Wt Readings from Last 3 Encounters:  08/16/18 141 lb 12.8 oz (64.3 kg)  08/14/18 141 lb (64 kg)  06/21/18 140 lb 6.4 oz (63.7 kg)    Physical Exam Cardiovascular:     Rate and Rhythm: Normal rate and regular rhythm.     Pulses: Normal pulses.     Heart sounds: Normal heart sounds.  Pulmonary:     Effort: Pulmonary effort is normal.  Musculoskeletal:     Right lower  leg: No edema.     Left lower leg: No edema.  Neurological:     Mental Status: She is alert.  Psychiatric:        Mood and Affect: Mood normal.        Behavior: Behavior normal.        Thought Content: Thought content normal.     Lab Results  Component Value Date   WBC 7.2 01/05/2015   HGB 14.6 01/05/2015   HCT 43.0 01/05/2015   PLT 298.0 01/05/2015   GLUCOSE 95 06/03/2018   CHOL 170 05/31/2018   TRIG 95.0 05/31/2018   HDL 60.80 05/31/2018   LDLCALC 90 05/31/2018   ALT 9 05/31/2018   AST 14 05/31/2018   NA 135 06/03/2018   K 3.9 06/03/2018   CL 101 06/03/2018   CREATININE 0.70 06/03/2018   BUN 10 06/03/2018   CO2 26 06/03/2018   TSH 0.56 01/05/2015    Assessment & Plan:   Emily Baker was seen today for establish care.  Diagnoses and all orders for this visit:  Benign essential HTN -     olmesartan (BENICAR) 20 MG tablet; Take 1 tablet (20 mg total) by mouth daily.  Anxiety -     Ambulatory referral to Psychology   I have changed Emily Baker "Emily Baker"'s olmesartan. I am also having her maintain her valACYclovir.  Meds ordered this encounter  Medications  .  olmesartan (BENICAR) 20 MG tablet    Sig: Take 1 tablet (20 mg total) by mouth daily.    Dispense:  90 tablet    Refill:  3    Order Specific Question:   Supervising Provider    Answer:   MATTHEWS, CODY [[id number]]    Problem List Items Addressed This Visit      Cardiovascular and Mediastinum   Benign essential HTN - Primary   Relevant Medications   olmesartan (BENICAR) 20 MG tablet    Other Visit Diagnoses    Anxiety       Relevant Orders   Ambulatory referral to Psychology       Follow-up: Return in about 10 months (around 06/02/2019) for CPE (fasting).  Wilfred Lacy, NP

## 2018-08-19 LAB — CYTOLOGY - PAP
Diagnosis: NEGATIVE
HPV: NOT DETECTED

## 2018-08-21 ENCOUNTER — Encounter: Payer: Commercial Managed Care - PPO | Admitting: Obstetrics & Gynecology

## 2018-08-28 MED FILL — VALACYCLOVIR HCL 500 MG TAB: 500 | 90 days supply | Qty: 90 | Fill #0

## 2018-09-11 ENCOUNTER — Ambulatory Visit: Payer: Self-pay | Admitting: Psychology

## 2018-10-02 MED FILL — OLMESARTAN MEDOXOMIL 20 MG: 20 | 90 days supply | Qty: 90 | Fill #0

## 2018-10-25 ENCOUNTER — Telehealth: Payer: BC Managed Care – PPO | Admitting: Family

## 2018-10-25 DIAGNOSIS — M5442 Lumbago with sciatica, left side: Secondary | ICD-10-CM

## 2018-10-25 DIAGNOSIS — M5441 Lumbago with sciatica, right side: Secondary | ICD-10-CM

## 2018-10-25 MED ORDER — BACLOFEN 10 MG PO TABS: 10.0000 mg | ORAL_TABLET | Freq: Three times a day (TID) | ORAL | 0 refills | Status: DC | PRN

## 2018-10-25 MED ORDER — ETODOLAC 300 MG PO CAPS: 300.0000 mg | ORAL_CAPSULE | Freq: Two times a day (BID) | ORAL | 0 refills | Status: DC

## 2018-10-25 NOTE — Progress Notes (Signed)
Greater than 5 minutes, yet less than 10 minutes of time have been spent researching, coordinating, and implementing care for this patient today.  Thank you for the details you included in the comment boxes. Those details are very helpful in determining the best course of treatment for you and help [place] to provide the best care.  We are sorry that you are not feeling well.  Here is how we plan to help!  Based on what you have shared with me it looks like you mostly have acute back pain.  Acute back pain is defined as musculoskeletal pain that can resolve in 1-3 weeks with conservative treatment.  I have prescribed Etodolac 300 mg twice a day non-steroid anti-inflammatory (NSAID) as well as Baclofen 10 mg every eight hours as needed which is a muscle relaxer  Some patients experience stomach irritation or in increased heartburn with anti-inflammatory drugs.  Please keep in mind that muscle relaxer's can cause fatigue and should not be taken while at work or driving.  Back pain is very common.  The pain often gets better over time.  The cause of back pain is usually not dangerous.  Most people can learn to manage their back pain on their own.  Home Care  Stay active.  Start with short walks on flat ground if you can.  Try to walk farther each day.  Do not sit, drive or stand in one place for more than 30 minutes.  Do not stay in bed.  Do not avoid exercise or work.  Activity can help your back heal faster.  Be careful when you bend or lift an object.  Bend at your knees, keep the object close to you, and do not twist.  Sleep on a firm mattress.  Lie on your side, and bend your knees.  If you lie on your back, put a pillow under your knees.  Only take medicines as told by your doctor.  Put ice on the injured area.  Put ice in a plastic bag  Place a towel between your skin and the bag  Leave the ice on for 15-20 minutes, 3-4 times a day for the first 2-3 days. 210 After that, you can switch  between ice and heat packs.  Ask your doctor about back exercises or massage.  Avoid feeling anxious or stressed.  Find good ways to deal with stress, such as exercise.  Get Help Right Way If:  Your pain does not go away with rest or medicine.  Your pain does not go away in 1 week.  You have new problems.  You do not feel well.  The pain spreads into your legs.  You cannot control when you poop (bowel movement) or pee (urinate)  You feel sick to your stomach (nauseous) or throw up (vomit)  You have belly (abdominal) pain.  You feel like you may pass out (faint).  If you develop a fever.  Make Sure you:  Understand these instructions.  Will watch your condition  Will get help right away if you are not doing well or get worse.  Your e-visit answers were reviewed by a board certified advanced clinical practitioner to complete your personal care plan.  Depending on the condition, your plan could have included both over the counter or prescription medications.  If there is a problem please reply  once you have received a response from your provider.  Your safety is important to [place].  If you have drug allergies check your prescription  carefully.    You can use MyChart to ask questions about today's visit, request a non-urgent call back, or ask for a work or school excuse for 24 hours related to this e-Visit. If it has been greater than 24 hours you will need to follow up with your provider, or enter a new e-Visit to address those concerns.  You will get an e-mail in the next two days asking about your experience.  I hope that your e-visit has been valuable and will speed your recovery. Thank you for using e-visits.

## 2018-12-25 ENCOUNTER — Telehealth: Payer: BC Managed Care – PPO | Admitting: Family

## 2018-12-25 ENCOUNTER — Telehealth: Payer: Self-pay | Admitting: Behavioral Health

## 2018-12-25 ENCOUNTER — Encounter: Payer: BC Managed Care – PPO | Admitting: Nurse Practitioner

## 2018-12-25 ENCOUNTER — Other Ambulatory Visit: Payer: Self-pay

## 2018-12-25 ENCOUNTER — Encounter (HOSPITAL_BASED_OUTPATIENT_CLINIC_OR_DEPARTMENT_OTHER): Payer: Self-pay

## 2018-12-25 ENCOUNTER — Emergency Department (HOSPITAL_BASED_OUTPATIENT_CLINIC_OR_DEPARTMENT_OTHER)
Admission: EM | Admit: 2018-12-25 | Discharge: 2018-12-25 | Disposition: A | Discharge status: Home / Self Care | Payer: BC Managed Care – PPO | Attending: Emergency Medicine | Admitting: Emergency Medicine

## 2018-12-25 DIAGNOSIS — F17210 Nicotine dependence, cigarettes, uncomplicated: Secondary | ICD-10-CM | POA: Insufficient documentation

## 2018-12-25 DIAGNOSIS — R030 Elevated blood-pressure reading, without diagnosis of hypertension: Secondary | ICD-10-CM

## 2018-12-25 DIAGNOSIS — I10 Essential (primary) hypertension: Secondary | ICD-10-CM | POA: Diagnosis not present

## 2018-12-25 DIAGNOSIS — Z79899 Other long term (current) drug therapy: Secondary | ICD-10-CM | POA: Insufficient documentation

## 2018-12-25 DIAGNOSIS — H109 Unspecified conjunctivitis: Secondary | ICD-10-CM

## 2018-12-25 MED ORDER — POLYMYXIN B-TRIMETHOPRIM 10000-0.1 UNIT/ML-% OP SOLN: 1.0000 [drp] | Freq: Four times a day (QID) | OPHTHALMIC | 0 refills | Status: DC

## 2018-12-25 NOTE — Telephone Encounter (Signed)
During pre-visit planning call the patient reported a blood pressure reading of 152/101, utilizing her home cuff. RN assessed for chest pain, lightheadedness/dizziness, headache & numbness/tingling of the limbs. Patient voiced that she did feel dizzy and had a slight headache. She also stated, "my nerves are really up right now". RN advised for patient to seek care at the nearest emergency department. She verbalized understanding and did not have any further questions or concerns.   FYI - Pt. declined cancellation of virtual appointment scheduled for today.

## 2018-12-25 NOTE — Progress Notes (Signed)

## 2018-12-25 NOTE — ED Provider Notes (Signed)
[facility] Provider Note   CSN: [id number] Arrival date & time: 12/25/18  1251     History   Chief Complaint Chief Complaint  Patient presents with  . Hypertension    HPI Emily Baker is a 48 y.o. female.     HPI  Patient is a 48 year old female with past medical history of headaches and hypertension presenting for elevated blood pressure reading and sensation of lightheadedness.  Patient reports that she had a virtual visit with her primary care provider today for left eye irritation.  She incidentally made a in person appointment and they were screening her for COVID-19 and had her check her blood pressure at home.  It was 152/101 and then the second reading was in the systolic 916B.  Patient reports that since around 10:30 AM she had been feeling lightheaded.  She had a normal amount of activity this morning, 1 cup of coffee, and ate oranges for breakfast.  This is normal for her.  Patient reports she had a mild headache that was not abnormal for her, but denies any significant visual disturbance, chest pain, shortness of breath, numbness, tingling, weakness, syncope or presyncope.  She took her Benicar this morning.  She reports she is asymptomatic currently, but feels very anxious.  She is anxious over the notion that she might have been screened for COVID-19 and she does not think she has had an exposure is not having any symptoms however she did go to a family gathering recently.  Regarding patient's eye drainage complaint, she was treated with outpatient antibiotics per primary care provider.  Past Medical History:  Diagnosis Date  . Hx of migraine headaches   . Hypertension     Patient Active Problem List   Diagnosis Date Noted  . Genital herpes 08/14/2018  . Abnormal mammogram 07/30/2017  . Mammographic microcalcification found on diagnostic imaging of breast 07/23/2017  . Bilateral low back pain without sciatica 06/07/2015  .  Chronic fatigue 11/04/2014  . Tobacco abuse disorder 11/04/2014  . Benign essential HTN 11/18/2013  . Headache, migraine 11/18/2013    Past Surgical History:  Procedure Laterality Date  . ENDOMETRIAL ABLATION    . FOOT SURGERY    . TUBAL LIGATION       OB History   No obstetric history on file.      Home Medications    Prior to Admission medications   Medication Sig Start Date End Date Taking? Authorizing Provider  olmesartan (BENICAR) 20 MG tablet Take 1 tablet (20 mg total) by mouth daily. 08/16/18   Nche, Charlene Brooke, NP  trimethoprim-polymyxin b (POLYTRIM) ophthalmic solution Place 1 drop into the left eye every 6 (six) hours. 12/25/18   Sharion Balloon, FNP  valACYclovir (VALTREX) 500 MG tablet Take 1 tablet (500 mg total) by mouth daily. Patient not taking: Reported on 12/25/2018 08/14/18   Lavonia Drafts, MD    Family History Family History  Problem Relation Age of Onset  . Healthy Child        x 4  . Hypertension Mother   . Hypertension Maternal Grandfather   . Colon cancer Maternal Grandfather 65  . Diabetes Mellitus II Paternal Grandmother   . Hyperlipidemia Brother   . Hypertension Brother   . Hyperlipidemia Brother     Social History Social History   Tobacco Use  . Smoking status: Current Every Day Smoker    Packs/day: 0.50    Types: Cigarettes  . Smokeless tobacco: Never Used  Substance Use Topics  . Alcohol use: Yes    Alcohol/week: 0.0 standard drinks    Comment: occasional  . Drug use: No     Allergies   Patient has no known allergies.   Review of Systems Review of Systems  Constitutional: Negative for chills and fever.  Eyes: Negative for visual disturbance.  Respiratory: Negative for shortness of breath.   Cardiovascular: Negative for chest pain and palpitations.  Gastrointestinal: Negative for nausea and vomiting.  Neurological: Positive for light-headedness. Negative for weakness, numbness and headaches.     Physical  Exam Updated Vital Signs BP (!) 164/92 (BP Location: Right Arm)   Pulse 93   Temp 98.3 F (36.8 C) (Oral)   Resp 14   Ht 5\' 3"  (1.6 m)   Wt 65.8 kg   SpO2 100%   BMI 25.69 kg/m   Physical Exam Vitals signs and nursing note reviewed.  Constitutional:      General: She is not in acute distress.    Appearance: She is well-developed.  HENT:     Head: Normocephalic and atraumatic.  Eyes:     Extraocular Movements: Extraocular movements intact.     Conjunctiva/sclera: Conjunctivae normal.     Pupils: Pupils are equal, round, and reactive to light.     Comments: Minimal left eye drainage. No significant conjunctival erythema or scleral injection.  Neck:     Musculoskeletal: Normal range of motion and neck supple.  Cardiovascular:     Rate and Rhythm: Normal rate and regular rhythm.     Heart sounds: S1 normal and S2 normal. No murmur.  Pulmonary:     Effort: Pulmonary effort is normal.     Breath sounds: Normal breath sounds. No wheezing or rales.  Abdominal:     General: There is no distension.  Musculoskeletal: Normal range of motion.        General: No deformity.  Lymphadenopathy:     Cervical: No cervical adenopathy.  Skin:    General: Skin is warm and dry.     Findings: No erythema or rash.  Neurological:     Mental Status: She is alert.     Comments: Cranial nerves grossly intact. Strength 5 out of 5 in upper and lower extremities. Patient moves extremities symmetrically and with good coordination.  Psychiatric:        Behavior: Behavior normal.        Thought Content: Thought content normal.        Judgment: Judgment normal.      ED Treatments / Results  Labs (all labs ordered are listed, but only abnormal results are displayed) Labs Reviewed - No data to display  EKG EKG Interpretation  Date/Time:  Wednesday December 25 2018 13:40:58 EDT Ventricular Rate:  66 PR Interval:    QRS Duration: 83 QT Interval:  384 QTC Calculation: 403 R Axis:   70 Text  Interpretation:  Sinus rhythm Confirmed by Isla Pence [phone]) on 12/25/2018 1:44:52 PM   Radiology No results found.  Procedures Procedures (including critical care time)  Medications Ordered in ED Medications - No data to display   Initial Impression / Assessment and Plan / ED Course  I have reviewed the triage vital signs and the nursing notes.  Pertinent labs & imaging results that were available during my care of the patient were reviewed by me and considered in my medical decision making (see chart for details).        This is a well-appearing 48 year old female with  past medical history of headaches and hypertension on Benicar presenting for elevated blood pressure readings via a virtual visit and sensation of lightheadedness.  She reports that now that she is laying down and relaxed that the lightheadedness is largely subsided.  She has not had any vertigo, focal neurologic symptoms, chest pain, shortness of breath, or other signs or symptoms of hypertensive urgency or emergency.  She has had a couple elevated blood pressure readings here in the emergency department.  Blood pressure in emergency department down to 134/82 after patient is relaxed.  Will check EKG given report of lightheadedness, but the patient is stable for outpatient follow-up for repeat BP in 1 week.  Return precautions given for any progressive lightheadedness, vertigo, visual disturbance, chest pain or shortness of breath, or weakness or numbness.  Patient is in understanding and agrees with plan of care.  Final Clinical Impressions(s) / ED Diagnoses   Final diagnoses:  Elevated blood pressure reading    ED Discharge Orders    None       Tamala Julian 12/25/18 1402    Isla Pence, MD 12/25/18 1408

## 2018-12-25 NOTE — ED Triage Notes (Signed)
Pt states she was to have a tele visit with PCP at 4pm for "pink eye"-during the prescreening she was asked to take her BP-it was elevated x 2-she was advised to come to ED-pt states she has felt lightheaded x 2-3 hours-NAD-to triage in w/c

## 2018-12-25 NOTE — Discharge Instructions (Signed)
Please see the information and instructions below regarding your visit.  Your diagnoses today include:  1. Elevated blood pressure reading     Tests performed today include: See side panel of your discharge paperwork for testing performed today. Vital signs are listed at the bottom of these instructions.   Medications prescribed:    Take any prescribed medications only as prescribed, and any over the counter medications only as directed on the packaging.  Continue to take your home blood pressure medication.  Home care instructions:  Please follow any educational materials contained in this packet.   Follow-up instructions: Please follow-up with your primary care provider in 1 week for further evaluation of your symptoms if they are not completely improved.   Return instructions:  Please return to the Emergency Department if you experience worsening symptoms.  Please come back to the emergency department if you develop any worsening lightheadedness, dizziness, blurred or double vision, chest pain, shortness of breath, weakness or numbness. Please return if you have any other emergent concerns.  Additional Information:   Your vital signs today were: BP (!) 164/92 (BP Location: Right Arm)    Pulse 93    Temp 98.3 F (36.8 C) (Oral)    Resp 14    Ht 5\' 3"  (1.6 m)    Wt 65.8 kg    SpO2 100%    BMI 25.69 kg/m  If your blood pressure (BP) was elevated on multiple readings during this visit above 130 for the top number or above 80 for the bottom number, please have this repeated by your primary care provider within one month. --------------  Thank you for allowing [place] to participate in your care today.

## 2018-12-25 NOTE — Telephone Encounter (Signed)
Pt is at the ED right now according to the chart. Emily Baker

## 2018-12-25 NOTE — ED Notes (Signed)
Pt on monitor 

## 2018-12-26 NOTE — Progress Notes (Signed)
This encounter was created in error - please disregard.

## 2019-01-06 MED FILL — OLMESARTAN MEDOXOMIL 20 MG: 20 | 90 days supply | Qty: 90 | Fill #1

## 2019-03-31 MED FILL — VALACYCLOVIR HCL 500 MG TAB: 500 | 90 days supply | Qty: 90 | Fill #1

## 2019-03-31 MED FILL — OLMESARTAN MEDOXOMIL 20 MG: 20 | 90 days supply | Qty: 90 | Fill #2

## 2019-04-10 ENCOUNTER — Encounter: Payer: Self-pay | Admitting: Family Medicine

## 2019-04-10 ENCOUNTER — Other Ambulatory Visit: Payer: Self-pay

## 2019-04-10 ENCOUNTER — Ambulatory Visit (INDEPENDENT_AMBULATORY_CARE_PROVIDER_SITE_OTHER): Payer: BC Managed Care – PPO | Admitting: Family Medicine

## 2019-04-10 VITALS — BP 133/75 | HR 78 | Ht 63.0 in | Wt 152.0 lb

## 2019-04-10 DIAGNOSIS — R1031 Right lower quadrant pain: Secondary | ICD-10-CM

## 2019-04-10 DIAGNOSIS — Z113 Encounter for screening for infections with a predominantly sexual mode of transmission: Secondary | ICD-10-CM

## 2019-04-10 DIAGNOSIS — N898 Other specified noninflammatory disorders of vagina: Secondary | ICD-10-CM

## 2019-04-10 MED ORDER — DICLOFENAC POTASSIUM 50 MG PO TABS: 50.0000 mg | ORAL_TABLET | Freq: Three times a day (TID) | ORAL | 3 refills | Status: DC

## 2019-04-10 MED FILL — DICLOFENAC POT 50 MG TABLET: 50 | 15 days supply | Qty: 45 | Fill #0

## 2019-04-10 NOTE — Progress Notes (Signed)
Pt states that she has been having abdominal pain x 4 months.

## 2019-04-10 NOTE — Progress Notes (Signed)
   Subjective:    Patient ID: Emily Baker, female    DOB: [birth year], 48 y.o.   MRN: [id number]  HPI Patient seen with RLQ pain that started 4 months ago. Initially intermittent, mild. Now severe at times and constant at times. Currently, mild tenderness. No paliating or provoking factors. No fever, chills, nausea, vomiting, constipation. No new sexual partners or abnormal discharge.  History of uterine ablation and myomectomy in 2009   Review of Systems     Objective:   Physical Exam Exam conducted with a chaperone present.  Constitutional:      Appearance: She is well-developed.  HENT:     Head: Normocephalic and atraumatic.  Abdominal:     General: Bowel sounds are normal.     Palpations: Abdomen is soft.     Tenderness: There is abdominal tenderness in the right lower quadrant. There is no right CVA tenderness, guarding or rebound. Negative signs include McBurney's sign.     Hernia: There is no hernia in the left inguinal area or right inguinal area.  Genitourinary:    Labia:        Right: No rash, tenderness or lesion.        Left: No rash, tenderness or lesion.      Urethra: No prolapse or urethral swelling.     Vagina: No signs of injury and foreign body. Vaginal discharge (physiologic) present. No erythema, tenderness, bleeding or lesions.     Cervix: No cervical motion tenderness, discharge, friability, lesion, erythema or cervical bleeding.     Uterus: Not deviated, not enlarged, not fixed, not tender and no uterine prolapse.      Adnexa:        Right: Tenderness and fullness present.        Left: No mass, tenderness or fullness.    Lymphadenopathy:     Lower Body: No right inguinal adenopathy. No left inguinal adenopathy.  Skin:    General: Skin is warm and dry.  Neurological:     Mental Status: She is alert.       Assessment & Plan:  1. Right lower quadrant abdominal pain ? Ovarian cyst. Diclofenac prescribed. Cultures done. Will check [place]. Pt to go to  ED with any constant, severe pain. - Cervicovaginal ancillary only( [place]) - US PELVIS (TRANSABDOMINAL ONLY); Future - US PELVIS TRANSVAGINAL NON-OB (TV ONLY); Future

## 2019-04-11 ENCOUNTER — Encounter (HOSPITAL_BASED_OUTPATIENT_CLINIC_OR_DEPARTMENT_OTHER): Payer: Self-pay

## 2019-04-11 ENCOUNTER — Ambulatory Visit (HOSPITAL_BASED_OUTPATIENT_CLINIC_OR_DEPARTMENT_OTHER)
Admission: RE | Admit: 2019-04-11 | Discharge: 2019-04-11 | Disposition: A | Discharge status: Home / Self Care | Payer: BC Managed Care – PPO | Source: Ambulatory Visit | Attending: Family Medicine | Admitting: Family Medicine

## 2019-04-11 DIAGNOSIS — R1031 Right lower quadrant pain: Secondary | ICD-10-CM | POA: Insufficient documentation

## 2019-04-11 DIAGNOSIS — D251 Intramural leiomyoma of uterus: Secondary | ICD-10-CM | POA: Diagnosis not present

## 2019-04-11 DIAGNOSIS — N83291 Other ovarian cyst, right side: Secondary | ICD-10-CM | POA: Diagnosis not present

## 2019-04-11 HISTORY — DX: Family history of malignant neoplasm of ovary: Z80.41

## 2019-04-17 LAB — CERVICOVAGINAL ANCILLARY ONLY
Bacterial Vaginitis (gardnerella): NEGATIVE
Candida Glabrata: NEGATIVE
Candida Vaginitis: NEGATIVE
Chlamydia: NEGATIVE
Comment: NEGATIVE
Comment: NEGATIVE
Comment: NEGATIVE
Comment: NEGATIVE
Comment: NEGATIVE
Comment: NORMAL
Neisseria Gonorrhea: NEGATIVE
Trichomonas: NEGATIVE

## 2019-04-18 MED ORDER — TRAMADOL HCL 50 MG PO TABS: 50.0000 mg | ORAL_TABLET | Freq: Four times a day (QID) | ORAL | 0 refills | Status: AC | PRN

## 2019-04-30 ENCOUNTER — Other Ambulatory Visit: Payer: Self-pay

## 2019-04-30 ENCOUNTER — Ambulatory Visit (INDEPENDENT_AMBULATORY_CARE_PROVIDER_SITE_OTHER): Payer: BC Managed Care – PPO | Admitting: Obstetrics & Gynecology

## 2019-04-30 ENCOUNTER — Encounter: Payer: Self-pay | Admitting: Obstetrics & Gynecology

## 2019-04-30 VITALS — BP 154/88 | HR 79 | Ht 63.0 in | Wt 153.1 lb

## 2019-04-30 DIAGNOSIS — N809 Endometriosis, unspecified: Secondary | ICD-10-CM

## 2019-04-30 DIAGNOSIS — R102 Pelvic and perineal pain: Secondary | ICD-10-CM | POA: Diagnosis not present

## 2019-04-30 MED ORDER — ORILISSA 150 MG PO TABS: 1.0000 | ORAL_TABLET | Freq: Every day | ORAL | 6 refills | Status: DC

## 2019-04-30 NOTE — Progress Notes (Signed)
History:  48 y.o. G4P4 here today for eval of pelvic pain. Pt has been seen in the past for this and her sx are not changed but, persistent. She is s/p laparoscopy which revealed endometriosis. Pt would like to attempt conservative measures for treatment.   The following portions of the patient's history were reviewed and updated as appropriate: allergies, current medications, past family history, past medical history, past social history, past surgical history and problem list.  Review of Systems:  Pertinent items are noted in HPI.    Objective:  Physical Exam Blood pressure (!) 154/88, pulse 79, height 5\' 3"  (1.6 m), weight 153 lb 1.3 oz (69.4 kg).  CONSTITUTIONAL: Well-developed, well-nourished female in no acute distress.  HENT:  Normocephalic, atraumatic EYES: Conjunctivae and EOM are normal. No scleral icterus.  NECK: Normal range of motion SKIN: Skin is warm and dry. No rash noted. Not diaphoretic.No pallor. [place]: Alert and oriented to person, place, and time. Normal coordination.  Abd: Soft, nontender and nondistended Pelvic: deferred  Labs and Imaging US Pelvis Transvaginal Non-ob (tv Only)  Result Date: 04/11/2019 CLINICAL DATA:  Worsening right lower quadrant and pelvic pain 4 months. Endometriosis. Previous endometrial ablation. EXAM: TRANSABDOMINAL AND TRANSVAGINAL ULTRASOUND OF PELVIS TECHNIQUE: Both transabdominal and transvaginal ultrasound examinations of the pelvis were performed. Transabdominal technique was performed for global imaging of the pelvis including uterus, ovaries, adnexal regions, and pelvic cul-de-sac. It was necessary to proceed with endovaginal exam following the transabdominal exam to visualize the endometrium and ovaries. COMPARISON:  None FINDINGS: Uterus Measurements: 8.0 x 4.3 x 4.9 cm = volume: 89 mL. Uterine myometrium has diffusely heterogeneous echotexture, consistent with prior endometrial ablation. Several distinct intramural fibroids are  seen in the posterior uterine corpus and fundus, measuring up to 2.0 cm. Endometrium Thickness: 3 mm. No focal abnormality visualized. Indistinct endometrial-myometrial junction, consistent history of endometrial ablation. Right ovary Measurements: 3.0 x 2.1 x 2.4 cm = volume: 8.1 mL. A small benign-appearing hemorrhagic cyst is seen measuring 2.1 cm maximum diameter. Left ovary Measurements: 2.1 x 1.0 x 2.1 cm = volume: 2.3 mL. Normal appearance/no adnexal mass. Other findings No abnormal free fluid. IMPRESSION: Several small uterine fibroids, largest measuring 2 cm. 2.1 cm benign-appearing hemorrhagic cyst in right ovary. Electronically Signed   By: Marlaine Hind M.D.   On: 04/11/2019 15:56   US Pelvis (transabdominal Only)  Result Date: 04/11/2019 CLINICAL DATA:  Worsening right lower quadrant and pelvic pain 4 months. Endometriosis. Previous endometrial ablation. EXAM: TRANSABDOMINAL AND TRANSVAGINAL ULTRASOUND OF PELVIS TECHNIQUE: Both transabdominal and transvaginal ultrasound examinations of the pelvis were performed. Transabdominal technique was performed for global imaging of the pelvis including uterus, ovaries, adnexal regions, and pelvic cul-de-sac. It was necessary to proceed with endovaginal exam following the transabdominal exam to visualize the endometrium and ovaries. COMPARISON:  None FINDINGS: Uterus Measurements: 8.0 x 4.3 x 4.9 cm = volume: 89 mL. Uterine myometrium has diffusely heterogeneous echotexture, consistent with prior endometrial ablation. Several distinct intramural fibroids are seen in the posterior uterine corpus and fundus, measuring up to 2.0 cm. Endometrium Thickness: 3 mm. No focal abnormality visualized. Indistinct endometrial-myometrial junction, consistent history of endometrial ablation. Right ovary Measurements: 3.0 x 2.1 x 2.4 cm = volume: 8.1 mL. A small benign-appearing hemorrhagic cyst is seen measuring 2.1 cm maximum diameter. Left ovary Measurements: 2.1 x 1.0 x  2.1 cm = volume: 2.3 mL. Normal appearance/no adnexal mass. Other findings No abnormal free fluid. IMPRESSION: Several small uterine fibroids, largest measuring 2 cm.  2.1 cm benign-appearing hemorrhagic cyst in right ovary. Electronically Signed   By: Marlaine Hind M.D.   On: 04/11/2019 15:56    Assessment & Plan:  Vonna was seen today for abdominal pain.  Diagnoses and all orders for this visit:  Endometriosis determined by laparoscopy -     Elagolix Sodium (ORILISSA) 150 MG TABS; Take 1 capsule by mouth daily.  Pelvic pain in female -     Elagolix Sodium (ORILISSA) 150 MG TABS; Take 1 capsule by mouth daily.  f/u in 6 weeks or sooner prn  Total face-to-face time with patient was 16 min.  Greater than 50% was spent in counseling and coordination of care with the patient.

## 2019-04-30 NOTE — Patient Instructions (Signed)
Elagolix tablets What is this medicine? ELAGOLIX (el a GOE lix) is used to treat endometriosis in women. It reduces pain from the condition and may help reduce painful sexual intercourse. This medicine may be used for other purposes; ask your health care provider or pharmacist if you have questions. COMMON BRAND NAME(S): Orilissa What should I tell my health care provider before I take this medicine? They need to know if you have any of these conditions:  liver disease  mental illness  osteoporosis  suicidal thoughts, plans, or attempt; a previous suicide attempt by you or a family member  an unusual or allergic reaction to elagolix, other medicines, foods, dyes, or preservatives  pregnant or trying to get pregnant  breast-feeding How should I use this medicine? Take this medicine by mouth with a glass of water. You can take it with or without food. Follow the directions on the prescription label. Take this medicine at the same time each day. Do not take your medicine more often than directed. A special MedGuide will be given to you by the pharmacist with each prescription and refill. Be sure to read this information carefully each time. Talk to your pediatrician regarding the use of this medicine in children. This medicine is not approved for use in children. Overdosage: If you think you have taken too much of this medicine contact a poison control center or emergency room at once. NOTE: This medicine is only for you. Do not share this medicine with others. What if I miss a dose? If you miss a dose, take it as soon as you can. If it is almost time for your next dose, take only that dose. Do not take double or extra doses. What may interact with this medicine? Do not take this medicine with any of the following medications:  cyclosporine  gemfibrozil This medicine may also interact with the following medications:  certain antiviral medicines for hepatitis, HIV or  AIDS  citalopram  digoxin  female hormones, like estrogens or progestins and birth control pills, patches, rings, or injections  methadone  midazolam  omeprazole  rifampin  rosuvastatin This list may not describe all possible interactions. Give your health care provider a list of all the medicines, herbs, non-prescription drugs, or dietary supplements you use. Also tell them if you smoke, drink alcohol, or use illegal drugs. Some items may interact with your medicine. What should I watch for while using this medicine? Visit your doctor or health care professional for regular checks on your progress. This medicine may cause weak bones (osteoporosis). Only use this product for the amount of time your health care professional tells you to. The longer you use this product the more likely you will be at risk for weak bones. Ask your health care professional how you can keep strong bones. You may have a change in bleeding pattern or irregular periods. Many females stop having periods while taking this drug. This medicine does not prevent pregnancy. Women must use effective birth control with this medicine. Use a non-hormonal form of birth control while taking this medicine and for 1 week after stopping it. Talk to your health care professional about how to prevent pregnancy. Do not become pregnant while taking this medicine. Women should inform their doctor if they wish to become pregnant or think they might be pregnant. There is a potential for serious side effects to an unborn child. Talk to your health care professional or pharmacist for more information. Patients and their families should watch   out for new or worsening depression or thoughts of suicide. Also watch out for sudden changes in feelings such as feeling anxious, agitated, panicky, irritable, hostile, aggressive, impulsive, severely restless, overly excited and hyperactive, or not being able to sleep. If this happens, call your health  care professional. What side effects may I notice from receiving this medicine? Side effects that you should report to your doctor or health care professional as soon as possible:  allergic reactions like skin rash, itching or hives, swelling of the face, lips, or tongue  anxious  depressed mood  signs and symptoms of liver injury like dark yellow or brown urine; general ill feeling or flu-like symptoms; light-colored stools; loss of appetite; nausea; right upper belly pain; unusually weak or tired; yellowing of the eyes or skin  suicidal thoughts or other mood changes Side effects that usually do not require medical attention (report these to your doctor or health care professional if they continue or are bothersome):  reduced or absent menstrual periods  headache  hot flashes or night sweats  nausea  joint pain  trouble sleeping This list may not describe all possible side effects. Call your doctor for medical advice about side effects. You may report side effects to FDA at 1-800-FDA-1088. Where should I keep my medicine? Keep out of the reach of children. Store at room temperature between 2 and 30 degrees C (36 and 86 degrees F). Throw away any unused medicine after the expiration date on the label. Discard any unused medicine and used packaging carefully. Follow the directions in the MedGuide. Do NOT flush down the toilet. NOTE: This sheet is a summary. It may not cover all possible information. If you have questions about this medicine, talk to your doctor, pharmacist, or health care provider.  2020 Elsevier/Gold Standard (2018-02-19 12:46:01)  

## 2019-05-12 ENCOUNTER — Encounter: Payer: Self-pay | Admitting: Obstetrics & Gynecology

## 2019-06-02 ENCOUNTER — Encounter: Payer: BLUE CROSS/BLUE SHIELD | Admitting: Family Medicine

## 2019-06-10 ENCOUNTER — Other Ambulatory Visit (HOSPITAL_BASED_OUTPATIENT_CLINIC_OR_DEPARTMENT_OTHER): Payer: Self-pay | Admitting: Obstetrics & Gynecology

## 2019-06-10 DIAGNOSIS — Z1231 Encounter for screening mammogram for malignant neoplasm of breast: Secondary | ICD-10-CM

## 2019-06-11 ENCOUNTER — Other Ambulatory Visit: Payer: Self-pay

## 2019-06-11 ENCOUNTER — Telehealth (INDEPENDENT_AMBULATORY_CARE_PROVIDER_SITE_OTHER): Payer: BC Managed Care – PPO | Admitting: Obstetrics & Gynecology

## 2019-06-11 ENCOUNTER — Ambulatory Visit (HOSPITAL_BASED_OUTPATIENT_CLINIC_OR_DEPARTMENT_OTHER)
Admission: RE | Admit: 2019-06-11 | Discharge: 2019-06-11 | Disposition: A | Discharge status: Home / Self Care | Payer: BC Managed Care – PPO | Source: Ambulatory Visit | Attending: Obstetrics & Gynecology | Admitting: Obstetrics & Gynecology

## 2019-06-11 ENCOUNTER — Encounter (HOSPITAL_BASED_OUTPATIENT_CLINIC_OR_DEPARTMENT_OTHER): Payer: Self-pay

## 2019-06-11 ENCOUNTER — Telehealth: Payer: BC Managed Care – PPO | Admitting: Obstetrics & Gynecology

## 2019-06-11 ENCOUNTER — Encounter: Payer: Self-pay | Admitting: Obstetrics & Gynecology

## 2019-06-11 DIAGNOSIS — N809 Endometriosis, unspecified: Secondary | ICD-10-CM

## 2019-06-11 DIAGNOSIS — Z1231 Encounter for screening mammogram for malignant neoplasm of breast: Secondary | ICD-10-CM | POA: Diagnosis not present

## 2019-06-11 DIAGNOSIS — R102 Pelvic and perineal pain: Secondary | ICD-10-CM | POA: Diagnosis not present

## 2019-06-11 NOTE — Progress Notes (Signed)
    TELEHEALTH GYNECOLOGY VIRTUAL VIDEO VISIT ENCOUNTER NOTE  Provider location: [facility] at [facility]   I connected with Emily Baker on 06/11/19 at  1:45 PM EST by MyChart Video Encounter at home and verified that I am speaking with the correct person using two identifiers.   I discussed the limitations, risks, security and privacy concerns of performing an evaluation and management service virtually and the availability of in person appointments. I also discussed with the patient that there may be a patient responsible charge related to this service. The patient expressed understanding and agreed to proceed.   History:  Emily Baker is a 48 y.o. G18P4 female being evaluated today for pelvic pain and h/o endometriosis. Pt has also had an endometrial ablation so does not have menses. She denies any abnormal vaginal discharge or bleeding. She reports that since beginning the [place] she has less pain. She reports some cramping but, noted improvement after 2 weeks or being on the meds.        Past Medical History:  Diagnosis Date  . Family history of ovarian cancer    Patient's mother  . Hx of migraine headaches   . Hypertension    Past Surgical History:  Procedure Laterality Date  . BREAST BIOPSY Right   . BREAST LUMPECTOMY Right 2019   Benign  . ENDOMETRIAL ABLATION  11/2007  . FOOT SURGERY Bilateral   . LAPAROSCOPY     X 3- Hx endometriosis per patient  . TUBAL LIGATION     The following portions of the patient's history were reviewed and updated as appropriate: allergies, current medications, past family history, past medical history, past social history, past surgical history and problem list.    Review of Systems:  Pertinent items noted in HPI and remainder of comprehensive ROS otherwise negative.  Physical Exam:   General:  Alert, oriented and cooperative. Patient appears to be in no acute distress.  Mental Status: Normal mood  and affect. Normal behavior. Normal judgment and thought content.   Respiratory: Normal respiratory effort, no problems with respiration noted  Rest of physical exam deferred due to type of encounter    Assessment and Plan:     Pelvic pain though to be due to endometriosis and possible adenomyosis post ablation. Her sx are improved on the Orilissa.      Cont current meds Orilissa 150mg  daily  F/u in 3 months or sooner prn   Annual at next visit.     I discussed the assessment and treatment plan with the patient. The patient was provided an opportunity to ask questions and all were answered. The patient agreed with the plan and demonstrated an understanding of the instructions.   The patient was advised to call back or seek an in-person evaluation/go to the ED if the symptoms worsen or if the condition fails to improve as anticipated.  I provided 15 minutes of face-to-face time during this encounter.   Lavonia Drafts, MD [facility], [place]

## 2019-07-21 ENCOUNTER — Encounter: Payer: Self-pay | Admitting: Obstetrics & Gynecology

## 2019-07-21 ENCOUNTER — Other Ambulatory Visit: Payer: Self-pay

## 2019-07-21 ENCOUNTER — Ambulatory Visit (INDEPENDENT_AMBULATORY_CARE_PROVIDER_SITE_OTHER): Payer: BC Managed Care – PPO | Admitting: Obstetrics & Gynecology

## 2019-07-21 VITALS — BP 162/98 | HR 89

## 2019-07-21 DIAGNOSIS — B3731 Acute candidiasis of vulva and vagina: Secondary | ICD-10-CM

## 2019-07-21 DIAGNOSIS — B373 Candidiasis of vulva and vagina: Secondary | ICD-10-CM

## 2019-07-21 DIAGNOSIS — A6000 Herpesviral infection of urogenital system, unspecified: Secondary | ICD-10-CM | POA: Diagnosis not present

## 2019-07-21 DIAGNOSIS — Z01419 Encounter for gynecological examination (general) (routine) without abnormal findings: Secondary | ICD-10-CM

## 2019-07-21 MED ORDER — VALACYCLOVIR HCL 500 MG PO TABS: 500.0000 mg | ORAL_TABLET | Freq: Every day | ORAL | 4 refills | Status: DC

## 2019-07-21 MED ORDER — CLOTRIMAZOLE-BETAMETHASONE 1-0.05 % EX CREA: 1.0000 "application " | TOPICAL_CREAM | Freq: Two times a day (BID) | CUTANEOUS | 0 refills | Status: DC

## 2019-07-21 NOTE — Progress Notes (Signed)
History:  49 y.o. G4P4 here today for eval of vaginal irritation. She reports that she has a h/o of HSV but, this feels different from her prev outbreaks and has not responded to the meds.  She reports med    The following portions of the patient's history were reviewed and updated as appropriate: allergies, current medications, past family history, past medical history, past social history, past surgical history and problem list.  Review of Systems:  Pertinent items are noted in HPI.    Objective:  Physical Exam Blood pressure (!) 162/98, pulse 89.  CONSTITUTIONAL: Well-developed, well-nourished female in no acute distress.  HENT:  Normocephalic, atraumatic EYES: Conjunctivae and EOM are normal. No scleral icterus.  NECK: Normal range of motion SKIN: Skin is warm and dry. No rash noted. Not diaphoretic.No pallor. [place]: Alert and oriented to person, place, and time. Normal coordination.  Abd: Soft, nontender and nondistended Pelvic: Normal appearing external genitalia; normal appearing vaginal mucosa and cervix.  Normal discharge.  Small uterus, no other palpable masses, no uterine or adnexal tenderness. There is some skin pigmentation in the upper thigh.    Assessment & Plan:  Diagnoses and all orders for this visit:  Herpes simplex infection of genitourinary system  Vulvovaginitis due to yeast -     clotrimazole-betamethasone (LOTRISONE) cream; Apply 1 application topically 2 (two) times daily.  Encounter for well woman exam with routine gynecological exam -     valACYclovir (VALTREX) 500 MG tablet; Take 1 tablet (500 mg total) by mouth daily.  Total face-to-face time with patient was 17 min.  Greater than 50% was spent in counseling and coordination of care with the patient.   Satia Winger L. Harraway-Smith, M.D., Cherlynn June

## 2019-07-21 NOTE — Progress Notes (Signed)
Patient complaining of vaginal irritation. Patient states she used aloe vera gel this weekend to relive the irritation. Kathrene Alu RN

## 2019-07-22 ENCOUNTER — Encounter: Payer: Self-pay | Admitting: Obstetrics & Gynecology

## 2019-07-24 NOTE — Progress Notes (Signed)
Please contact and schedule appt for HTN f/up. Thank you

## 2019-07-25 NOTE — Progress Notes (Signed)
I called and left message on patient voicemail to call office and schedule follow up appointment per [facility].

## 2019-07-28 ENCOUNTER — Ambulatory Visit: Payer: BC Managed Care – PPO | Admitting: Obstetrics & Gynecology

## 2019-08-28 ENCOUNTER — Ambulatory Visit: Payer: BC Managed Care – PPO | Admitting: Obstetrics & Gynecology

## 2019-10-14 ENCOUNTER — Other Ambulatory Visit: Payer: Self-pay

## 2019-10-15 ENCOUNTER — Encounter: Payer: Self-pay | Admitting: Nurse Practitioner

## 2019-10-15 ENCOUNTER — Ambulatory Visit (INDEPENDENT_AMBULATORY_CARE_PROVIDER_SITE_OTHER): Payer: BC Managed Care – PPO | Admitting: Nurse Practitioner

## 2019-10-15 VITALS — BP 128/82 | HR 80 | Temp 97.5°F | Ht 63.0 in | Wt 148.2 lb

## 2019-10-15 DIAGNOSIS — R202 Paresthesia of skin: Secondary | ICD-10-CM | POA: Diagnosis not present

## 2019-10-15 DIAGNOSIS — Z13220 Encounter for screening for lipoid disorders: Secondary | ICD-10-CM | POA: Diagnosis not present

## 2019-10-15 DIAGNOSIS — Z136 Encounter for screening for cardiovascular disorders: Secondary | ICD-10-CM | POA: Diagnosis not present

## 2019-10-15 DIAGNOSIS — Z0001 Encounter for general adult medical examination with abnormal findings: Secondary | ICD-10-CM

## 2019-10-15 DIAGNOSIS — I10 Essential (primary) hypertension: Secondary | ICD-10-CM | POA: Diagnosis not present

## 2019-10-15 DIAGNOSIS — E559 Vitamin D deficiency, unspecified: Secondary | ICD-10-CM | POA: Diagnosis not present

## 2019-10-15 DIAGNOSIS — E538 Deficiency of other specified B group vitamins: Secondary | ICD-10-CM

## 2019-10-15 DIAGNOSIS — Z720 Tobacco use: Secondary | ICD-10-CM | POA: Diagnosis not present

## 2019-10-15 LAB — COMPREHENSIVE METABOLIC PANEL
ALT: 9 U/L (ref 0–35)
AST: 12 U/L (ref 0–37)
Albumin: 4.2 g/dL (ref 3.5–5.2)
Alkaline Phosphatase: 47 U/L (ref 39–117)
BUN: 9 mg/dL (ref 6–23)
CO2: 28 mEq/L (ref 19–32)
Calcium: 8.9 mg/dL (ref 8.4–10.5)
Chloride: 105 mEq/L (ref 96–112)
Creatinine, Ser: 0.61 mg/dL (ref 0.40–1.20)
GFR: 126.52 mL/min (ref >60.00)
Glucose, Bld: 88 mg/dL (ref 70–99)
Potassium: 4.3 mEq/L (ref 3.5–5.1)
Sodium: 138 mEq/L (ref 135–145)
Total Bilirubin: 0.6 mg/dL (ref 0.2–1.2)
Total Protein: 6.5 g/dL (ref 6.0–8.3)

## 2019-10-15 LAB — LIPID PANEL
Cholesterol: 172 mg/dL (ref 0–200)
HDL: 47.2 mg/dL (ref >39.00)
LDL Cholesterol: 111 mg/dL — ABNORMAL HIGH (ref 0–99)
NonHDL: 124.56
Total CHOL/HDL Ratio: 4
Triglycerides: 67 mg/dL (ref 0.0–149.0)
VLDL: 13.4 mg/dL (ref 0.0–40.0)

## 2019-10-15 LAB — VITAMIN B12: Vitamin B-12: 239 pg/mL (ref 211–911)

## 2019-10-15 LAB — TSH: TSH: 0.89 u[IU]/mL (ref 0.35–4.50)

## 2019-10-15 LAB — VITAMIN D 25 HYDROXY (VIT D DEFICIENCY, FRACTURES): VITD: 11.01 ng/mL — ABNORMAL LOW (ref 30.00–100.00)

## 2019-10-15 MED ORDER — OLMESARTAN MEDOXOMIL 20 MG PO TABS: 20.0000 mg | ORAL_TABLET | Freq: Every day | ORAL | 3 refills | Status: DC

## 2019-10-15 NOTE — Assessment & Plan Note (Addendum)
Smokes 1/2ppd since age 49 Quit during pregnancy and for 68yrs (2012-2014) without any assistance. She is not interested in use of patch or any drug. Trigger: stress Advised about potential risk factors with chronic and continuous tobacco use. She verbalized understanding.

## 2019-10-15 NOTE — Patient Instructions (Addendum)
Normal cbc, iron panel, and CMP Vitamin B12 on low sent of normal. Due to risk of low absorption, I am change oral vitamin B12 to SQ injection. Schedule nurse visit for SQ injection week x 5weeks, then monthly continuously. Will repeat labs in 12weeks Very low vitamin D. Sent supplement to take for 12weeks. return to lab for repeat vit D level Pending ANA  Continue to work on tobacco cessation.  If normal labs, will refer to neurology for paresthesia eval.  Preventive Care 60-64 Years Old, Female Preventive care refers to visits with your health care provider and lifestyle choices that can promote health and wellness. This includes:  A yearly physical exam. This may also be called an annual well check.  Regular dental visits and eye exams.  Immunizations.  Screening for certain conditions.  Healthy lifestyle choices, such as eating a healthy diet, getting regular exercise, not using drugs or products that contain nicotine and tobacco, and limiting alcohol use. What can I expect for my preventive care visit? Physical exam Your health care provider will check your:  Height and weight. This may be used to calculate body mass index (BMI), which tells if you are at a healthy weight.  Heart rate and blood pressure.  Skin for abnormal spots. Counseling Your health care provider may ask you questions about your:  Alcohol, tobacco, and drug use.  Emotional well-being.  Home and relationship well-being.  Sexual activity.  Eating habits.  Work and work Statistician.  Method of birth control.  Menstrual cycle.  Pregnancy history. What immunizations do I need?  Influenza (flu) vaccine  This is recommended every year. Tetanus, diphtheria, and pertussis (Tdap) vaccine  You may need a Td booster every 10 years. Varicella (chickenpox) vaccine  You may need this if you have not been vaccinated. Zoster (shingles) vaccine  You may need this after age 45. Measles, mumps,  and rubella (MMR) vaccine  You may need at least one dose of MMR if you were born in 1957 or later. You may also need a second dose. Pneumococcal conjugate (PCV13) vaccine  You may need this if you have certain conditions and were not previously vaccinated. Pneumococcal polysaccharide (PPSV23) vaccine  You may need one or two doses if you smoke cigarettes or if you have certain conditions. Meningococcal conjugate (MenACWY) vaccine  You may need this if you have certain conditions. Hepatitis A vaccine  You may need this if you have certain conditions or if you travel or work in places where you may be exposed to hepatitis A. Hepatitis B vaccine  You may need this if you have certain conditions or if you travel or work in places where you may be exposed to hepatitis B. Haemophilus influenzae type b (Hib) vaccine  You may need this if you have certain conditions. Human papillomavirus (HPV) vaccine  If recommended by your health care provider, you may need three doses over 6 months. You may receive vaccines as individual doses or as more than one vaccine together in one shot (combination vaccines). Talk with your health care provider about the risks and benefits of combination vaccines. What tests do I need? Blood tests  Lipid and cholesterol levels. These may be checked every 5 years, or more frequently if you are over 35 years old.  Hepatitis C test.  Hepatitis B test. Screening  Lung cancer screening. You may have this screening every year starting at age 79 if you have a 30-pack-year history of smoking and currently smoke or  have quit within the past 15 years.  Colorectal cancer screening. All adults should have this screening starting at age 50 and continuing until age 75. Your health care provider may recommend screening at age 45 if you are at increased risk. You will have tests every 1-10 years, depending on your results and the type of screening test.  Diabetes screening.  This is done by checking your blood sugar (glucose) after you have not eaten for a while (fasting). You may have this done every 1-3 years.  Mammogram. This may be done every 1-2 years. Talk with your health care provider about when you should start having regular mammograms. This may depend on whether you have a family history of breast cancer.  BRCA-related cancer screening. This may be done if you have a family history of breast, ovarian, tubal, or peritoneal cancers.  Pelvic exam and Pap test. This may be done every 3 years starting at age 21. Starting at age 30, this may be done every 5 years if you have a Pap test in combination with an HPV test. Other tests  Sexually transmitted disease (STD) testing.  Bone density scan. This is done to screen for osteoporosis. You may have this scan if you are at high risk for osteoporosis. Follow these instructions at home: Eating and drinking  Eat a diet that includes fresh fruits and vegetables, whole grains, lean protein, and low-fat dairy.  Take vitamin and mineral supplements as recommended by your health care provider.  Do not drink alcohol if: ? Your health care provider tells you not to drink. ? You are pregnant, may be pregnant, or are planning to become pregnant.  If you drink alcohol: ? Limit how much you have to 0-1 drink a day. ? Be aware of how much alcohol is in your drink. In the [place] one drink equals one 12 oz bottle of beer (355 mL), one 5 oz glass of wine (148 mL), or one 1 oz glass of hard liquor (44 mL). Lifestyle  Take daily care of your teeth and gums.  Stay active. Exercise for at least 30 minutes on 5 or more days each week.  Do not use any products that contain nicotine or tobacco, such as cigarettes, e-cigarettes, and chewing tobacco. If you need help quitting, ask your health care provider.  If you are sexually active, practice safe sex. Use a condom or other form of birth control (contraception) in order to  prevent pregnancy and STIs (sexually transmitted infections).  If told by your health care provider, take low-dose aspirin daily starting at age 50. What's next?  Visit your health care provider once a year for a well check visit.  Ask your health care provider how often you should have your eyes and teeth checked.  Stay up to date on all vaccines. This information is not intended to replace advice given to you by your health care provider. Make sure you discuss any questions you have with your health care provider. Document Revised: 02/14/2018 Document Reviewed: 02/14/2018 Elsevier Patient Education  2020 Elsevier Inc.  

## 2019-10-15 NOTE — Assessment & Plan Note (Signed)
BP at goal with benicar.  BP Readings from Last 3 Encounters:  10/15/19 128/82  07/21/19 (!) 162/98  04/30/19 (!) 154/88

## 2019-10-15 NOTE — Progress Notes (Addendum)
Subjective:    Patient ID: Emily Baker, female    DOB: July 20, 1970, 49 y.o.   MRN: [id number]  Patient presents today for complete physical  and eval of HTN and paresthesia HPI  Sexual History (orientation,birth control, marital status, STD):married, sexually active, PAP and pelvic exam performed by GYN  Depression/Suicide: Depression screen [facility] 2/9 10/15/2019 08/16/2018  Decreased Interest 0 0  Down, Depressed, Hopeless 0 0  PHQ - 2 Score 0 0   Vision:up to date  Dental:up to date  Immunizations: (TDAP, Hep C screen, Pneumovax, Influenza, zoster)  Health Maintenance  Topic Date Due  . COVID-19 Vaccine (1) Never done  . Flu Shot  01/18/2020  . Pap Smear  08/14/2021  . Tetanus Vaccine  10/17/2025  . HIV Screening  Completed   Diet:regular.  Weight:  Wt Readings from Last 3 Encounters:  10/15/19 148 lb 3.2 oz (67.2 kg)  04/30/19 153 lb 1.3 oz (69.4 kg)  04/10/19 152 lb 0.6 oz (69 kg)    Exercise:walking  Fall Risk: Fall Risk  10/15/2019 08/16/2018  Falls in the past year? 0 0  Number falls in past yr: 0 -  Injury with Fall? 0 -   Advanced Directive: Advanced Directives 12/25/2018  Does Patient Have a Medical Advance Directive? No  Would patient like information on creating a medical advance directive? -     Medications and allergies reviewed with patient and updated if appropriate.  Patient Active Problem List   Diagnosis Date Noted  . Paresthesia 10/15/2019  . Genital herpes 08/14/2018  . Abnormal mammogram 07/30/2017  . Microcalcification of right breast on mammogram 07/23/2017  . Low back pain 06/07/2015  . Tobacco abuse disorder 11/04/2014  . Essential (primary) hypertension 11/18/2013  . Migraine headache 11/18/2013    Current Outpatient Medications on File Prior to Visit  Medication Sig Dispense Refill  . Elagolix Sodium (ORILISSA) 150 MG TABS Take 1 capsule by mouth daily. 30 tablet 6  . valACYclovir (VALTREX) 500 MG tablet Take 1 tablet (500  mg total) by mouth daily. 90 tablet 4  . cyanocobalamin (,VITAMIN B-12,) 1000 MCG/ML injection Weekly x 5weeks, then monthly continuously 1 mL 0   No current facility-administered medications on file prior to visit.    Past Medical History:  Diagnosis Date  . Family history of ovarian cancer    Patient's mother  . Hx of migraine headaches   . Hypertension     Past Surgical History:  Procedure Laterality Date  . BREAST BIOPSY Right   . BREAST EXCISIONAL BIOPSY Right    benign  . BREAST LUMPECTOMY Right 2019   Benign  . ENDOMETRIAL ABLATION  11/2007  . FOOT SURGERY Bilateral   . LAPAROSCOPY     X 3- Hx endometriosis per patient  . TUBAL LIGATION      Social History   Socioeconomic History  . Marital status: Married    Spouse name: Tyrone  . Number of children: 4  . Years of education: Not on file  . Highest education level: Not on file  Occupational History  . Occupation: Press photographer  Tobacco Use  . Smoking status: Current Every Day Smoker    Packs/day: 0.50    Types: Cigarettes  . Smokeless tobacco: Never Used  Substance and Sexual Activity  . Alcohol use: Yes    Alcohol/week: 0.0 standard drinks    Comment: occasional  . Drug use: No  . Sexual activity: Yes    Partners: Male  Birth control/protection: Surgical    Comment: uterine ablation  Other Topics Concern  . Not on file  Social History Narrative  . Not on file   Social Determinants of Health   Financial Resource Strain:   . Difficulty of Paying Living Expenses:   Food Insecurity:   . Worried About Charity fundraiser in the Last Year:   . Arboriculturist in the Last Year:   Transportation Needs:   . Film/video editor (Medical):   Marland Kitchen Lack of Transportation (Non-Medical):   Physical Activity:   . Days of Exercise per Week:   . Minutes of Exercise per Session:   Stress:   . Feeling of Stress :   Social Connections:   . Frequency of Communication with Friends and Family:   .  Frequency of Social Gatherings with Friends and Family:   . Attends Religious Services:   . Active Member of Clubs or Organizations:   . Attends Archivist Meetings:   Marland Kitchen Marital Status:     Family History  Problem Relation Age of Onset  . Healthy Child        x 4  . Hypertension Mother   . Cancer Mother        Ovarian cancer  . Hypertension Maternal Grandfather   . Colon cancer Maternal Grandfather 67  . Diabetes Mellitus II Paternal Grandmother   . Hyperlipidemia Brother   . Hypertension Brother   . Hyperlipidemia Brother        Review of Systems  Constitutional: Negative for fever, malaise/fatigue and weight loss.  HENT: Negative for congestion and sore throat.   Eyes:       Negative for visual changes  Respiratory: Negative for cough and shortness of breath.   Cardiovascular: Negative for chest pain, palpitations and leg swelling.  Gastrointestinal: Negative for blood in stool, constipation, diarrhea and heartburn.  Genitourinary: Negative for dysuria, frequency and urgency.  Musculoskeletal: Negative for falls, joint pain and myalgias.  Skin: Negative for rash.  Neurological: Positive for tingling and sensory change. Negative for dizziness, tremors, focal weakness, weakness and headaches.  Endo/Heme/Allergies: Does not bruise/bleed easily.  Psychiatric/Behavioral: Negative for depression, substance abuse and suicidal ideas. The patient is not nervous/anxious.     Objective:   Vitals:   10/15/19 0828  BP: 128/82  Pulse: 80  Temp: (!) 97.5 F (36.4 C)  SpO2: 100%    Body mass index is 26.25 kg/m.   Physical Examination:  Physical Exam Vitals reviewed.  Constitutional:      General: She is not in acute distress.    Appearance: She is well-developed.  HENT:     Right Ear: Tympanic membrane, ear canal and external ear normal.     Left Ear: Tympanic membrane, ear canal and external ear normal.  Eyes:     Extraocular Movements: Extraocular  movements intact.     Conjunctiva/sclera: Conjunctivae normal.  Cardiovascular:     Rate and Rhythm: Normal rate and regular rhythm.     Chest Wall: No thrill.     Pulses: Normal pulses.     Heart sounds: Normal heart sounds.     Comments: No abdominal bruit or pulsating mass. No femoral bruit Pulmonary:     Effort: Pulmonary effort is normal. No respiratory distress.     Breath sounds: Normal breath sounds.  Chest:     Chest wall: No tenderness.  Abdominal:     General: Bowel sounds are normal.  Palpations: Abdomen is soft.  Genitourinary:    Comments: Deferred breast and pelvic exam to GYN per patient Musculoskeletal:        General: Normal range of motion.     Right lower leg: No edema.     Left lower leg: No edema.  Skin:    General: Skin is warm and dry.  Neurological:     Mental Status: She is alert and oriented to person, place, and time.     Cranial Nerves: Cranial nerves are intact.     Motor: Motor function is intact.     Coordination: Coordination is intact.     Gait: Gait is intact.     Deep Tendon Reflexes: Babinski sign absent on the right side. Babinski sign absent on the left side.     Reflex Scores:      Brachioradialis reflexes are 1+ on the right side and 1+ on the left side.      Patellar reflexes are 1+ on the right side and 1+ on the left side.      Achilles reflexes are 1+ on the right side and 1+ on the left side. Psychiatric:        Mood and Affect: Mood normal.        Behavior: Behavior normal.        Thought Content: Thought content normal.    ASSESSMENT and PLAN: This visit occurred during the SARS-CoV-2 public health emergency.  Safety protocols were in place, including screening questions prior to the visit, additional usage of staff PPE, and extensive cleaning of exam room while observing appropriate contact time as indicated for disinfecting solutions.   Syvanna was seen today for annual exam.  Diagnoses and all orders for this  visit:  Encounter for preventative adult health care exam with abnormal findings -     Comprehensive metabolic panel -     Lipid panel -     CBC -     TSH  Benign essential HTN -     olmesartan (BENICAR) 20 MG tablet; Take 1 tablet (20 mg total) by mouth daily.  Tobacco abuse disorder  Encounter for lipid screening for cardiovascular disease -     Lipid panel  Paresthesia -     B12 -     Antinuclear Antib (ANA) -     Iron, TIBC and Ferritin Panel -     Vitamin D 1,25 dihydroxy; Future -     B12; Future  Vitamin D insufficiency -     VITAMIN D 25 Hydroxy (Vit-D Deficiency, Fractures) -     Vitamin D, Ergocalciferol, (DRISDOL) 1.25 MG (50000 UNIT) CAPS capsule; Take 1 capsule (50,000 Units total) by mouth every 7 (seven) days. -     Vitamin D 1,25 dihydroxy; Future  Vitamin B12 deficiency -     B12; Future    Tobacco abuse disorder Smokes 1/2ppd since age 27 Quit during pregnancy and for 37yrs (2012-2014) without any assistance. She is not interested in use of patch or any drug. Trigger: stress Advised about potential risk factors with chronic and continuous tobacco use. She verbalized understanding.  Paresthesia Onset 3weeks ago, worsening, constant, same all day Describes as numbness and cold sensation of hands and feet. Not affected by position or ambulation No weakness, no muscle atrophy, no swelling. No FHx of autoimmune disorder.  Low vitamin D. Start high dose weekly supplement x 12weeks Vitamin B12 on low sent of normal. Due to risk of low absorption, I am change  oral vitamin B12 to SQ injection. Schedule nurse visit for SQ injection week x 5weeks, then monthly continuously. Will repeat labs in 12weeks   Essential (primary) hypertension BP at goal with benicar.  BP Readings from Last 3 Encounters:  10/15/19 128/82  07/21/19 (!) 162/98  04/30/19 (!) 154/88      Problem List Items Addressed This Visit      Cardiovascular and Mediastinum   Essential  (primary) hypertension    BP at goal with benicar.  BP Readings from Last 3 Encounters:  10/15/19 128/82  07/21/19 (!) 162/98  04/30/19 (!) 154/88        Relevant Medications   olmesartan (BENICAR) 20 MG tablet     Other   Paresthesia    Onset 3weeks ago, worsening, constant, same all day Describes as numbness and cold sensation of hands and feet. Not affected by position or ambulation No weakness, no muscle atrophy, no swelling. No FHx of autoimmune disorder.  Low vitamin D. Start high dose weekly supplement x 12weeks Vitamin B12 on low sent of normal. Due to risk of low absorption, I am change oral vitamin B12 to SQ injection. Schedule nurse visit for SQ injection week x 5weeks, then monthly continuously. Will repeat labs in 12weeks       Relevant Medications   cyanocobalamin (,VITAMIN B-12,) 1000 MCG/ML injection   Other Relevant Orders   B12 (Completed)   Antinuclear Antib (ANA)   Iron, TIBC and Ferritin Panel (Completed)   Vitamin D 1,25 dihydroxy   B12   Tobacco abuse disorder    Smokes 1/2ppd since age 89 Quit during pregnancy and for 28yrs (2012-2014) without any assistance. She is not interested in use of patch or any drug. Trigger: stress Advised about potential risk factors with chronic and continuous tobacco use. She verbalized understanding.       Other Visit Diagnoses    Encounter for preventative adult health care exam with abnormal findings    -  Primary   Relevant Orders   Comprehensive metabolic panel (Completed)   Lipid panel (Completed)   CBC (Completed)   TSH (Completed)   Encounter for lipid screening for cardiovascular disease       Relevant Orders   Lipid panel (Completed)   Vitamin D insufficiency       Relevant Medications   Vitamin D, Ergocalciferol, (DRISDOL) 1.25 MG (50000 UNIT) CAPS capsule   Other Relevant Orders   VITAMIN D 25 Hydroxy (Vit-D Deficiency, Fractures) (Completed)   Vitamin D 1,25 dihydroxy   Vitamin B12  deficiency       Relevant Medications   cyanocobalamin (,VITAMIN B-12,) 1000 MCG/ML injection   Other Relevant Orders   B12      Follow up: Return in about 6 months (around 04/15/2020) for HTN.  Wilfred Lacy, NP

## 2019-10-15 NOTE — Assessment & Plan Note (Addendum)
Onset 3weeks ago, worsening, constant, same all day Describes as numbness and cold sensation of hands and feet. Not affected by position or ambulation No weakness, no muscle atrophy, no swelling. No FHx of autoimmune disorder.  Low vitamin D. Start high dose weekly supplement x 12weeks Vitamin B12 on low sent of normal. Due to risk of low absorption, I am change oral vitamin B12 to SQ injection. Schedule nurse visit for SQ injection week x 5weeks, then monthly continuously. Will repeat labs in 12weeks

## 2019-10-16 LAB — CBC
HCT: 37.8 % (ref 36.0–46.0)
Hemoglobin: 12.9 g/dL (ref 12.0–15.0)
MCHC: 34.1 g/dL (ref 30.0–36.0)
MCV: 95.6 fl (ref 78.0–100.0)
Platelets: 300 10*3/uL (ref 150.0–400.0)
RBC: 3.96 Mil/uL (ref 3.87–5.11)
RDW: 14 % (ref 11.5–15.5)
WBC: 6.2 10*3/uL (ref 4.0–10.5)

## 2019-10-16 LAB — IRON,TIBC AND FERRITIN PANEL
%SAT: 41 % (calc) (ref 16–45)
Ferritin: 149 ng/mL (ref 16–232)
Iron: 124 ug/dL (ref 40–190)
TIBC: 301 mcg/dL (calc) (ref 250–450)

## 2019-10-16 LAB — ANA: Anti Nuclear Antibody (ANA): NEGATIVE

## 2019-10-16 MED ORDER — VITAMIN D (ERGOCALCIFEROL) 1.25 MG (50000 UNIT) PO CAPS: 50000.0000 [IU] | ORAL_CAPSULE | ORAL | 0 refills | Status: DC

## 2019-10-16 NOTE — Addendum Note (Signed)
Addended by: Leana Gamer on: 10/16/2019 04:02 PM   Modules accepted: Orders

## 2019-10-22 ENCOUNTER — Other Ambulatory Visit: Payer: Self-pay

## 2019-10-23 ENCOUNTER — Ambulatory Visit (INDEPENDENT_AMBULATORY_CARE_PROVIDER_SITE_OTHER): Payer: BC Managed Care – PPO

## 2019-10-23 DIAGNOSIS — D510 Vitamin B12 deficiency anemia due to intrinsic factor deficiency: Secondary | ICD-10-CM

## 2019-10-23 MED ORDER — CYANOCOBALAMIN 1000 MCG/ML IJ SOLN
1000.0000 ug | Freq: Once | INTRAMUSCULAR | Status: AC
Start: 2019-10-23 — End: 2019-10-23
  Administered 2019-10-23: 1000 ug via INTRAMUSCULAR

## 2019-10-23 NOTE — Progress Notes (Signed)
After obtaining consent, and per orders of Wilfred Lacy, NP, injection #1 of 5 weekly of B-12 given in left deltoid by Jaylene Schrom Berneta Sages. Patient instructed to remain in clinic for 20 minutes afterwards, and to report any adverse reaction to me immediately.

## 2019-10-23 NOTE — Patient Instructions (Signed)
Schedule #2 of 5 B-12 injections for next week :)

## 2019-10-29 ENCOUNTER — Other Ambulatory Visit: Payer: Self-pay

## 2019-10-30 ENCOUNTER — Ambulatory Visit (INDEPENDENT_AMBULATORY_CARE_PROVIDER_SITE_OTHER): Payer: BC Managed Care – PPO

## 2019-10-30 DIAGNOSIS — D510 Vitamin B12 deficiency anemia due to intrinsic factor deficiency: Secondary | ICD-10-CM | POA: Diagnosis not present

## 2019-10-30 MED ORDER — CYANOCOBALAMIN 1000 MCG/ML IJ SOLN
1000.0000 ug | Freq: Once | INTRAMUSCULAR | Status: AC
  Administered 2019-10-30: 1000 ug via INTRAMUSCULAR

## 2019-10-30 NOTE — Progress Notes (Signed)
After obtaining consent, and per orders of Wilfred Lacy, NP, injection of B-12 given in right deltoid by Adan Baehr Berneta Sages. Patient instructed to remain in clinic for 20 minutes afterwards, and to report any adverse reaction to me immediately.

## 2019-10-30 NOTE — Progress Notes (Signed)
Medical screening examination/treatment/procedure(s) were performed by the CMA. As primary care provider I was immediately available for consulation/collaboration. I agree with above documentation. Geniva Lohnes, AGNP-C 

## 2019-11-03 NOTE — Progress Notes (Signed)
Medical screening examination/treatment/procedure(s) were performed by the CMA. As primary care provider I was immediately available for consulation/collaboration. I agree with above documentation. Anari Evitt, AGNP-C 

## 2019-11-05 ENCOUNTER — Other Ambulatory Visit: Payer: Self-pay

## 2019-11-06 ENCOUNTER — Ambulatory Visit (INDEPENDENT_AMBULATORY_CARE_PROVIDER_SITE_OTHER): Payer: BC Managed Care – PPO | Admitting: Behavioral Health

## 2019-11-06 DIAGNOSIS — E538 Deficiency of other specified B group vitamins: Secondary | ICD-10-CM

## 2019-11-06 MED ORDER — CYANOCOBALAMIN 1000 MCG/ML IJ SOLN
1000.0000 ug | Freq: Once | INTRAMUSCULAR | Status: AC
  Administered 2019-11-06: 1000 ug via INTRAMUSCULAR

## 2019-11-06 NOTE — Progress Notes (Signed)
Patient presents in clinic today for weekly B12 injection, #3. IM injection was given in the right deltoid. Patient tolerated it well. No signs or symptoms of a reaction were noted prior to patient leaving the nurse visit. Next appointment has been scheduled for 11/13/19 at 8:20 AM.

## 2019-11-06 NOTE — Progress Notes (Signed)
Medical screening examination/treatment/procedure(s) were performed by the RN. As primary care provider I was immediately available for consulation/collaboration. I agree with above documentation. Hondo Nanda, AGNP-C 

## 2019-11-12 ENCOUNTER — Other Ambulatory Visit: Payer: Self-pay

## 2019-11-13 ENCOUNTER — Ambulatory Visit (INDEPENDENT_AMBULATORY_CARE_PROVIDER_SITE_OTHER): Payer: BC Managed Care – PPO

## 2019-11-13 DIAGNOSIS — E538 Deficiency of other specified B group vitamins: Secondary | ICD-10-CM | POA: Diagnosis not present

## 2019-11-13 MED ORDER — CYANOCOBALAMIN 1000 MCG/ML IJ SOLN
1000.0000 ug | Freq: Once | INTRAMUSCULAR | Status: AC
  Administered 2019-11-13: 1000 ug via INTRAMUSCULAR

## 2019-11-13 NOTE — Progress Notes (Signed)
Patient presents in clinic today for weekly B12 injection, #4. IM injection was given in the left deltoid. Patient tolerated it well. No signs or symptoms of a reaction were noted prior to patient leaving the nurse visit.

## 2019-11-13 NOTE — Progress Notes (Signed)
Medical screening examination/treatment/procedure(s) were performed by the CMA. As primary care provider I was immediately available for consulation/collaboration. I agree with above documentation. Nakkia Mackiewicz, AGNP-C 

## 2019-11-13 NOTE — Patient Instructions (Signed)
Health Maintenance Due  Topic Date Due  . COVID-19 Vaccine (1) Never done    Depression screen [facility] 2/9 10/15/2019 08/16/2018  Decreased Interest 0 0  Down, Depressed, Hopeless 0 0  PHQ - 2 Score 0 0

## 2019-11-19 ENCOUNTER — Other Ambulatory Visit: Payer: Self-pay

## 2019-11-20 ENCOUNTER — Ambulatory Visit (INDEPENDENT_AMBULATORY_CARE_PROVIDER_SITE_OTHER): Payer: BC Managed Care – PPO

## 2019-11-20 DIAGNOSIS — E538 Deficiency of other specified B group vitamins: Secondary | ICD-10-CM | POA: Diagnosis not present

## 2019-11-20 MED ORDER — CYANOCOBALAMIN 1000 MCG/ML IJ SOLN
1000.0000 ug | Freq: Once | INTRAMUSCULAR | Status: AC
  Administered 2019-11-20: 1000 ug via INTRAMUSCULAR

## 2019-11-20 NOTE — Progress Notes (Addendum)
After obtaining consent, and per orders of Wilfred Lacy, NP, injection of B-12 given in left deltoid by Xadrian Craighead Berneta Sages. Patient instructed to remain in clinic for 20 minutes afterwards, and to report any adverse reaction to me immediately.  agreed

## 2019-12-18 ENCOUNTER — Ambulatory Visit (INDEPENDENT_AMBULATORY_CARE_PROVIDER_SITE_OTHER): Payer: BC Managed Care – PPO

## 2019-12-18 ENCOUNTER — Other Ambulatory Visit: Payer: Self-pay

## 2019-12-18 DIAGNOSIS — E538 Deficiency of other specified B group vitamins: Secondary | ICD-10-CM

## 2019-12-18 MED ORDER — CYANOCOBALAMIN 1000 MCG/ML IJ SOLN
1000.0000 ug | Freq: Once | INTRAMUSCULAR | Status: AC
  Administered 2019-12-18: 1000 ug via INTRAMUSCULAR

## 2019-12-18 MED ORDER — CYANOCOBALAMIN 1000 MCG/ML IJ SOLN: 1000.0000 ug | Freq: Once | INTRAMUSCULAR | Status: DC

## 2019-12-18 NOTE — Progress Notes (Signed)
Per orders of [facility]  injection of B12 given by Eissa Buchberger L Tracey Stewart in Right deltoid. Patient tolerated injection well. Patient will make appointment for 1 month.

## 2019-12-18 NOTE — Patient Instructions (Signed)
Health Maintenance Due  Topic Date Due  . Hepatitis C Screening  Never done  . COVID-19 Vaccine (1) Never done    Depression screen [facility] 2/9 10/15/2019 08/16/2018  Decreased Interest 0 0  Down, Depressed, Hopeless 0 0  PHQ - 2 Score 0 0

## 2019-12-19 NOTE — Progress Notes (Signed)
Medical screening examination/treatment/procedure(s) were performed by the CMA. As primary care provider I was immediately available for consulation/collaboration. I agree with above documentation. Mardell Cragg, AGNP-C 

## 2020-01-13 ENCOUNTER — Telehealth: Payer: BC Managed Care – PPO | Admitting: Family

## 2020-01-13 DIAGNOSIS — J019 Acute sinusitis, unspecified: Secondary | ICD-10-CM | POA: Diagnosis not present

## 2020-01-13 DIAGNOSIS — B9789 Other viral agents as the cause of diseases classified elsewhere: Secondary | ICD-10-CM

## 2020-01-13 MED ORDER — FLUTICASONE PROPIONATE 50 MCG/ACT NA SUSP
2.0000 | Freq: Every day | NASAL | 6 refills | Status: DC
Start: 2020-01-13 — End: 2020-11-09

## 2020-01-13 NOTE — Progress Notes (Signed)
We are sorry that you are not feeling well.  Here is how we plan to help!  Based on what you have shared with me it looks like you have sinusitis.  Sinusitis is inflammation and infection in the sinus cavities of the head.  Based on your presentation I believe you most likely have Acute Viral Sinusitis.This is an infection most likely caused by a virus. There is not specific treatment for viral sinusitis other than to help you with the symptoms until the infection runs its course.  You may use an oral decongestant such as Mucinex D or if you have glaucoma or high blood pressure use plain Mucinex. Saline nasal spray help and can safely be used as often as needed for congestion, I have prescribed: Fluticasone nasal spray two sprays in each nostril once a day  Some authorities believe that zinc sprays or the use of Echinacea may shorten the course of your symptoms.  Sinus infections are not as easily transmitted as other respiratory infection, however we still recommend that you avoid close contact with loved ones, especially the very young and elderly.  Remember to wash your hands thoroughly throughout the day as this is the number one way to prevent the spread of infection!  Home Care:  Only take medications as instructed by your medical team.  Do not take these medications with alcohol.  A steam or ultrasonic humidifier can help congestion.  You can place a towel over your head and breathe in the steam from hot water coming from a faucet.  Avoid close contacts especially the very young and the elderly.  Cover your mouth when you cough or sneeze.  Always remember to wash your hands.  Get Help Right Away If:  You develop worsening fever or sinus pain.  You develop a severe head ache or visual changes.  Your symptoms persist after you have completed your treatment plan.  Make sure you  Understand these instructions.  Will watch your condition.  Will get help right away if you are not  doing well or get worse.  Your e-visit answers were reviewed by a board certified advanced clinical practitioner to complete your personal care plan.  Depending on the condition, your plan could have included both over the counter or prescription medications.  If there is a problem please reply  once you have received a response from your provider.  Your safety is important to us.  If you have drug allergies check your prescription carefully.    You can use MyChart to ask questions about today's visit, request a non-urgent call back, or ask for a work or school excuse for 24 hours related to this e-Visit. If it has been greater than 24 hours you will need to follow up with your provider, or enter a new e-Visit to address those concerns.  You will get an e-mail in the next two days asking about your experience.  I hope that your e-visit has been valuable and will speed your recovery. Thank you for using e-visits.  Greater than 5 minutes, yet less than 10 minutes of time have been spent researching, coordinating, and implementing care for this patient today.  Thank you for the details you included in the comment boxes. Those details are very helpful in determining the best course of treatment for you and help us to provide the best care.  

## 2020-01-15 ENCOUNTER — Telehealth (INDEPENDENT_AMBULATORY_CARE_PROVIDER_SITE_OTHER): Payer: BC Managed Care – PPO | Admitting: Family Medicine

## 2020-01-15 ENCOUNTER — Encounter: Payer: Self-pay | Admitting: Family Medicine

## 2020-01-15 VITALS — Ht 63.0 in

## 2020-01-15 DIAGNOSIS — J0100 Acute maxillary sinusitis, unspecified: Secondary | ICD-10-CM | POA: Diagnosis not present

## 2020-01-15 MED ORDER — AMOXICILLIN-POT CLAVULANATE 875-125 MG PO TABS: 1.0000 | ORAL_TABLET | Freq: Two times a day (BID) | ORAL | 0 refills | Status: DC

## 2020-01-15 NOTE — Progress Notes (Signed)
Virtual Visit via Video Note  I connected with Emily Baker on 01/15/20 at  2:00 PM EDT by a video enabled telemedicine application and verified that I am speaking with the correct person using two identifiers. Location patient: home Location provider: work Persons participating in the virtual visit: patient, provider  I discussed the limitations of evaluation and management by telemedicine and the availability of in person appointments. The patient expressed understanding and agreed to proceed.  Chief Complaint  Patient presents with  . Acute Visit    Pt c/o cough, congestion and headache, and sore throat x 4day.  Pt has been taking nasal spray and plain mucinex, w/no relief.     HPI: Emily Baker is a 49 y.o. female who complains of 5 day h/o sore throat, headache, nasal congestion, runny nose, cough. Some sneezing. No loss of taste or smell. Pt has been taking xyzal, using flonase nasal spray, and taking mucinex w/o relief. She had e-visit with Dutch Quint on 01/13/20.  She has not had a covid vaccine.  She recently visited family in [place], [place]. She stayed in a hotel. No known sick contacts.   Past Medical History:  Diagnosis Date  . Family history of ovarian cancer    Patient's mother  . Hx of migraine headaches   . Hypertension     Past Surgical History:  Procedure Laterality Date  . BREAST BIOPSY Right   . BREAST EXCISIONAL BIOPSY Right    benign  . BREAST LUMPECTOMY Right 2019   Benign  . ENDOMETRIAL ABLATION  11/2007  . FOOT SURGERY Bilateral   . LAPAROSCOPY     X 3- Hx endometriosis per patient  . TUBAL LIGATION      Family History  Problem Relation Age of Onset  . Healthy Child        x 4  . Hypertension Mother   . Cancer Mother        Ovarian cancer  . Hypertension Maternal Grandfather   . Colon cancer Maternal Grandfather 41  . Diabetes Mellitus II Paternal Grandmother   . Hyperlipidemia Brother   . Hypertension Brother   .  Hyperlipidemia Brother     Social History   Tobacco Use  . Smoking status: Current Every Day Smoker    Packs/day: 0.50    Types: Cigarettes  . Smokeless tobacco: Never Used  Vaping Use  . Vaping Use: Never used  Substance Use Topics  . Alcohol use: Yes    Alcohol/week: 0.0 standard drinks    Comment: occasional  . Drug use: No   Immunization History  Administered Date(s) Administered  . Tdap 11/17/2005, 10/18/2015     Current Outpatient Medications:  .  cyanocobalamin (,VITAMIN B-12,) 1000 MCG/ML injection, Weekly x 5weeks, then monthly continuously, Disp: 1 mL, Rfl: 0 .  Elagolix Sodium (ORILISSA) 150 MG TABS, Take 1 capsule by mouth daily., Disp: 30 tablet, Rfl: 6 .  fluticasone (FLONASE) 50 MCG/ACT nasal spray, Place 2 sprays into both nostrils daily., Disp: 16 g, Rfl: 6 .  olmesartan (BENICAR) 20 MG tablet, Take 1 tablet (20 mg total) by mouth daily., Disp: 90 tablet, Rfl: 3 .  valACYclovir (VALTREX) 500 MG tablet, Take 1 tablet (500 mg total) by mouth daily., Disp: 90 tablet, Rfl: 4 .  Vitamin D, Ergocalciferol, (DRISDOL) 1.25 MG (50000 UNIT) CAPS capsule, Take 1 capsule (50,000 Units total) by mouth every 7 (seven) days. (Patient not taking: Reported on 01/15/2020), Disp: 12 capsule, Rfl: 0  No Known  Allergies    ROS: See pertinent positives and negatives per HPI.   EXAM:  VITALS per patient if applicable: Ht 5\' 3"  (1.6 m)   BMI 26.25 kg/m   GENERAL: alert, oriented, in no acute distress  HEENT: atraumatic, conjunctiva clear, no obvious abnormalities on inspection of external nose and ears; sounds congested   NECK: normal movements of the head and neck  LUNGS: on inspection no signs of respiratory distress, breathing rate appears normal, no obvious gross SOB, gasping or wheezing, no conversational dyspnea  CV: no obvious cyanosis  PSYCH/NEURO: pleasant and cooperative, no obvious depression or anxiety, speech and thought processing grossly  intact   ASSESSMENT AND PLAN:  1. Acute non-recurrent maxillary sinusitis - cont with flonase, muxinex - increase water intake Rx: - amoxicillin-clavulanate (AUGMENTIN) 875-125 MG tablet; Take 1 tablet by mouth 2 (two) times daily.  Dispense: 20 tablet; Refill: 0 - note work given - recommended covid test to r/o covid-19 infection - f/u if symptoms worsen or do not improve in 7-10 days Discussed plan and reviewed medications with patient, including risks, benefits, and potential side effects. Pt expressed understand. All questions answered.     I discussed the assessment and treatment plan with the patient. The patient was provided an opportunity to ask questions and all were answered. The patient agreed with the plan and demonstrated an understanding of the instructions.   The patient was advised to call back or seek an in-person evaluation if the symptoms worsen or if the condition fails to improve as anticipated.   Letta Median, DO

## 2020-01-22 ENCOUNTER — Ambulatory Visit (INDEPENDENT_AMBULATORY_CARE_PROVIDER_SITE_OTHER): Payer: BC Managed Care – PPO

## 2020-01-22 DIAGNOSIS — E538 Deficiency of other specified B group vitamins: Secondary | ICD-10-CM | POA: Diagnosis not present

## 2020-01-22 MED ORDER — CYANOCOBALAMIN 1000 MCG/ML IJ SOLN
1000.0000 ug | Freq: Once | INTRAMUSCULAR | Status: AC
  Administered 2020-01-22: 1000 ug via INTRAMUSCULAR

## 2020-01-22 NOTE — Patient Instructions (Signed)
Health Maintenance Due  Topic Date Due  . Hepatitis C Screening  Never done  . COVID-19 Vaccine (1) Never done  . INFLUENZA VACCINE  01/18/2020    Depression screen [facility] 2/9 10/15/2019 08/16/2018  Decreased Interest 0 0  Down, Depressed, Hopeless 0 0  PHQ - 2 Score 0 0

## 2020-01-22 NOTE — Progress Notes (Signed)
After obtaining consent, and per orders of [facility], injection of B12  Given in Lt deltiod by Shalen Petrak L Charli Halle. Patient instructed to remain in clinic for 20 minutes afterwards, and to report any adverse reaction to me immediately.

## 2020-01-27 NOTE — Progress Notes (Signed)
Medical screening examination/treatment/procedure(s) were performed by the CMA. As primary care provider I was immediately available for consulation/collaboration. I agree with above documentation. Charlotte Nche, AGNP-C 

## 2020-02-19 ENCOUNTER — Ambulatory Visit (INDEPENDENT_AMBULATORY_CARE_PROVIDER_SITE_OTHER): Payer: BC Managed Care – PPO

## 2020-02-19 ENCOUNTER — Other Ambulatory Visit: Payer: Self-pay

## 2020-02-19 DIAGNOSIS — E538 Deficiency of other specified B group vitamins: Secondary | ICD-10-CM | POA: Diagnosis not present

## 2020-02-19 MED ORDER — CYANOCOBALAMIN 1000 MCG/ML IJ SOLN
1000.0000 ug | Freq: Once | INTRAMUSCULAR | Status: AC
  Administered 2020-02-19: 1000 ug via INTRAMUSCULAR

## 2020-02-19 NOTE — Progress Notes (Signed)
Per orders of NP Nche, injection of B12 given in right deltoid by Eustace Pen, RN.  Patient tolerated injection well.

## 2020-02-24 NOTE — Progress Notes (Signed)
Medical screening examination/treatment/procedure(s) were performed by the RN. As primary care provider I was immediately available for consulation/collaboration. I agree with above documentation. Charlotte Nche, AGNP-C 

## 2020-03-25 ENCOUNTER — Ambulatory Visit: Payer: BC Managed Care – PPO

## 2020-07-26 ENCOUNTER — Inpatient Hospital Stay (HOSPITAL_BASED_OUTPATIENT_CLINIC_OR_DEPARTMENT_OTHER): Admission: RE | Admit: 2020-07-26 | Payer: BC Managed Care – PPO | Source: Ambulatory Visit

## 2020-07-26 ENCOUNTER — Other Ambulatory Visit (HOSPITAL_BASED_OUTPATIENT_CLINIC_OR_DEPARTMENT_OTHER): Payer: Self-pay | Admitting: Nurse Practitioner

## 2020-07-26 DIAGNOSIS — Z1231 Encounter for screening mammogram for malignant neoplasm of breast: Secondary | ICD-10-CM

## 2020-08-02 ENCOUNTER — Ambulatory Visit (HOSPITAL_BASED_OUTPATIENT_CLINIC_OR_DEPARTMENT_OTHER)
Admission: RE | Admit: 2020-08-02 | Discharge: 2020-08-02 | Disposition: A | Discharge status: Home / Self Care | Payer: 59 | Source: Ambulatory Visit | Attending: Nurse Practitioner | Admitting: Nurse Practitioner

## 2020-08-02 ENCOUNTER — Encounter (HOSPITAL_BASED_OUTPATIENT_CLINIC_OR_DEPARTMENT_OTHER): Payer: Self-pay

## 2020-08-02 ENCOUNTER — Other Ambulatory Visit: Payer: Self-pay

## 2020-08-02 DIAGNOSIS — Z1231 Encounter for screening mammogram for malignant neoplasm of breast: Secondary | ICD-10-CM | POA: Diagnosis present

## 2020-11-09 ENCOUNTER — Other Ambulatory Visit (HOSPITAL_BASED_OUTPATIENT_CLINIC_OR_DEPARTMENT_OTHER): Payer: Self-pay

## 2020-11-09 ENCOUNTER — Other Ambulatory Visit: Payer: Self-pay

## 2020-11-09 ENCOUNTER — Ambulatory Visit (INDEPENDENT_AMBULATORY_CARE_PROVIDER_SITE_OTHER): Payer: 59 | Admitting: Nurse Practitioner

## 2020-11-09 ENCOUNTER — Encounter: Payer: Self-pay | Admitting: Nurse Practitioner

## 2020-11-09 VITALS — BP 120/84 | HR 70 | Temp 98.4°F | Ht 63.25 in | Wt 147.4 lb

## 2020-11-09 DIAGNOSIS — I10 Essential (primary) hypertension: Secondary | ICD-10-CM

## 2020-11-09 DIAGNOSIS — Z1211 Encounter for screening for malignant neoplasm of colon: Secondary | ICD-10-CM | POA: Diagnosis not present

## 2020-11-09 DIAGNOSIS — F17209 Nicotine dependence, unspecified, with unspecified nicotine-induced disorders: Secondary | ICD-10-CM

## 2020-11-09 DIAGNOSIS — Z716 Tobacco abuse counseling: Secondary | ICD-10-CM | POA: Diagnosis not present

## 2020-11-09 DIAGNOSIS — E538 Deficiency of other specified B group vitamins: Secondary | ICD-10-CM | POA: Diagnosis not present

## 2020-11-09 DIAGNOSIS — Z136 Encounter for screening for cardiovascular disorders: Secondary | ICD-10-CM | POA: Diagnosis not present

## 2020-11-09 DIAGNOSIS — E559 Vitamin D deficiency, unspecified: Secondary | ICD-10-CM | POA: Diagnosis not present

## 2020-11-09 DIAGNOSIS — Z13220 Encounter for screening for lipoid disorders: Secondary | ICD-10-CM | POA: Diagnosis not present

## 2020-11-09 DIAGNOSIS — Z1159 Encounter for screening for other viral diseases: Secondary | ICD-10-CM

## 2020-11-09 DIAGNOSIS — Z0001 Encounter for general adult medical examination with abnormal findings: Secondary | ICD-10-CM

## 2020-11-09 LAB — CBC WITH DIFFERENTIAL/PLATELET
Basophils Absolute: 0 10*3/uL (ref 0.0–0.1)
Basophils Relative: 0.5 % (ref 0.0–3.0)
Eosinophils Absolute: 0 10*3/uL (ref 0.0–0.7)
Eosinophils Relative: 0.8 % (ref 0.0–5.0)
HCT: 41.4 % (ref 36.0–46.0)
Hemoglobin: 14.3 g/dL (ref 12.0–15.0)
Lymphocytes Relative: 39.9 % (ref 12.0–46.0)
Lymphs Abs: 2.2 10*3/uL (ref 0.7–4.0)
MCHC: 34.5 g/dL (ref 30.0–36.0)
MCV: 93.2 fl (ref 78.0–100.0)
Monocytes Absolute: 0.4 10*3/uL (ref 0.1–1.0)
Monocytes Relative: 6.4 % (ref 3.0–12.0)
Neutro Abs: 2.9 10*3/uL (ref 1.4–7.7)
Neutrophils Relative %: 52.4 % (ref 43.0–77.0)
Platelets: 292 10*3/uL (ref 150.0–400.0)
RBC: 4.44 Mil/uL (ref 3.87–5.11)
RDW: 13.4 % (ref 11.5–15.5)
WBC: 5.5 10*3/uL (ref 4.0–10.5)

## 2020-11-09 LAB — COMPREHENSIVE METABOLIC PANEL
ALT: 9 U/L (ref 0–35)
AST: 15 U/L (ref 0–37)
Albumin: 4.6 g/dL (ref 3.5–5.2)
Alkaline Phosphatase: 52 U/L (ref 39–117)
BUN: 8 mg/dL (ref 6–23)
CO2: 28 mEq/L (ref 19–32)
Calcium: 9.5 mg/dL (ref 8.4–10.5)
Chloride: 101 mEq/L (ref 96–112)
Creatinine, Ser: 0.54 mg/dL (ref 0.40–1.20)
GFR: 108.16 mL/min (ref >60.00)
Glucose, Bld: 89 mg/dL (ref 70–99)
Potassium: 4.1 mEq/L (ref 3.5–5.1)
Sodium: 138 mEq/L (ref 135–145)
Total Bilirubin: 0.6 mg/dL (ref 0.2–1.2)
Total Protein: 7.2 g/dL (ref 6.0–8.3)

## 2020-11-09 LAB — LIPID PANEL
Cholesterol: 198 mg/dL (ref 0–200)
HDL: 68.7 mg/dL (ref >39.00)
LDL Cholesterol: 116 mg/dL — ABNORMAL HIGH (ref 0–99)
NonHDL: 128.9
Total CHOL/HDL Ratio: 3
Triglycerides: 65 mg/dL (ref 0.0–149.0)
VLDL: 13 mg/dL (ref 0.0–40.0)

## 2020-11-09 LAB — VITAMIN B12: Vitamin B-12: 444 pg/mL (ref 211–911)

## 2020-11-09 LAB — TSH: TSH: 0.67 u[IU]/mL (ref 0.35–4.50)

## 2020-11-09 MED ORDER — OLMESARTAN MEDOXOMIL 20 MG PO TABS
20.0000 mg | ORAL_TABLET | Freq: Every day | ORAL | 3 refills | Status: DC
  Filled 2020-11-09: qty 90, 90d supply, fill #0
  Filled 2021-03-01: qty 90, 90d supply, fill #1
  Filled 2021-04-06: qty 60, 60d supply, fill #1
  Filled 2021-06-10: qty 30, 30d supply, fill #2
  Filled 2021-07-15: qty 30, 30d supply, fill #3

## 2020-11-09 MED ORDER — VARENICLINE TARTRATE 0.5 MG PO TABS
ORAL_TABLET | ORAL | 2 refills | Status: DC
Start: 2020-11-09 — End: 2021-08-08
  Filled 2020-11-09: qty 56, 28d supply, fill #0

## 2020-11-09 NOTE — Progress Notes (Signed)
Subjective:    Patient ID: Emily Baker, female    DOB: 1971-04-04, 50 y.o.   MRN: [id number]  Patient presents today for CPE and eval of chronic conditions  HPI Tobacco abuse disorder States she is ready to quit Currently smokes 1pack every 3days. No claudication, normal peripheral pulses, no edema Does not have a quit date at this time Unable to quit with nicotine patch alone.  She agreed to start chantix. rx sent Provided printed information on medication F/up in 66month  Essential (primary) hypertension BP at goal BP Readings from Last 3 Encounters:  11/09/20 120/84  10/15/19 128/82  07/21/19 (!) 162/98   Repeat BMP Maintain benicar dose  Vitamin B12 deficiency No oral supplement Improved parathesia Repeat B12  Vitamin D insufficiency No oral supplement Repeat Vit. D  Sexual History (orientation,birth control, marital status, STD):sexuall active, pelvic and breast exam completed by GYN, no need for STD screen today.  Depression/Suicide: Depression screen [facility] 2/9 11/09/2020 10/15/2019 08/16/2018  Decreased Interest 0 0 0  Down, Depressed, Hopeless 0 0 0  PHQ - 2 Score 0 0 0   Vision:will schedule  Dental:will schedule  Immunizations: (TDAP, Hep C screen, Pneumovax, Influenza, zoster)  Health Maintenance  Topic Date Due  . COVID-19 Vaccine (1) Never done  . Hepatitis C Screening: USPSTF Recommendation to screen - Ages 49-79 yo.  Never done  . Colon Cancer Screening  Never done  . Flu Shot  01/17/2021  . Pap Smear  08/14/2021  . Tetanus Vaccine  10/21/2025  . HIV Screening  Completed  . HPV Vaccine  Aged Out   Diet:regular. Exercise: none Weight:  Wt Readings from Last 3 Encounters:  11/09/20 147 lb 6.4 oz (66.9 kg)  10/15/19 148 lb 3.2 oz (67.2 kg)  04/30/19 153 lb 1.3 oz (69.4 kg)   Medications and allergies reviewed with patient and updated if appropriate.  Patient Active Problem List   Diagnosis Date Noted  . Vitamin D insufficiency  11/09/2020  . Vitamin B12 deficiency 11/09/2020  . Paresthesia 10/15/2019  . Genital herpes 08/14/2018  . Abnormal mammogram 07/30/2017  . Microcalcification of right breast on mammogram 07/23/2017  . Low back pain 06/07/2015  . Tobacco abuse disorder 11/04/2014  . Essential (primary) hypertension 11/18/2013  . Migraine headache 11/18/2013   Current Outpatient Medications on File Prior to Visit  Medication Sig Dispense Refill  . Elagolix Sodium (ORILISSA) 150 MG TABS Take 1 capsule by mouth daily. 30 tablet 6   No current facility-administered medications on file prior to visit.    Past Medical History:  Diagnosis Date  . Family history of ovarian cancer    Patient's mother  . Hx of migraine headaches   . Hypertension     Past Surgical History:  Procedure Laterality Date  . BREAST BIOPSY Right   . BREAST EXCISIONAL BIOPSY Right 2019  . ENDOMETRIAL ABLATION  11/2007  . FOOT SURGERY Bilateral   . LAPAROSCOPY     X 3- Hx endometriosis per patient  . TUBAL LIGATION      Social History   Socioeconomic History  . Marital status: Married    Spouse name: Tyrone  . Number of children: 4  . Years of education: Not on file  . Highest education level: Not on file  Occupational History  . Occupation: Press photographer  Tobacco Use  . Smoking status: Current Every Day Smoker    Packs/day: 0.50    Types: Cigarettes  . Smokeless tobacco: Never Used  Vaping Use  . Vaping Use: Never used  Substance and Sexual Activity  . Alcohol use: Yes    Alcohol/week: 0.0 standard drinks    Comment: occasional  . Drug use: No  . Sexual activity: Yes    Partners: Male    Birth control/protection: Surgical    Comment: uterine ablation  Other Topics Concern  . Not on file  Social History Narrative  . Not on file   Social Determinants of Health   Financial Resource Strain: Not on file  Food Insecurity: Not on file  Transportation Needs: Not on file  Physical Activity: Not on file   Stress: Not on file  Social Connections: Not on file    Family History  Problem Relation Age of Onset  . Healthy Child        x 4  . Hypertension Mother   . Cancer Mother        Ovarian cancer  . Hypertension Maternal Grandfather   . Colon cancer Maternal Grandfather 61  . Diabetes Mellitus II Paternal Grandmother   . Hyperlipidemia Brother   . Hypertension Brother   . Hyperlipidemia Brother         ROS  Objective:   Vitals:   11/09/20 0820  BP: 120/84  Pulse: 70  Temp: 98.4 F (36.9 C)  SpO2: 100%    Body mass index is 25.9 kg/m.   Physical Examination:  Physical Exam Vitals reviewed.  Constitutional:      General: She is not in acute distress. HENT:     Right Ear: Tympanic membrane, ear canal and external ear normal.     Left Ear: Tympanic membrane, ear canal and external ear normal.  Eyes:     General: No scleral icterus.    Extraocular Movements: Extraocular movements intact.     Conjunctiva/sclera: Conjunctivae normal.     Pupils: Pupils are equal, round, and reactive to light.  Neck:     Thyroid: No thyromegaly.  Cardiovascular:     Rate and Rhythm: Normal rate and regular rhythm.     Pulses: Normal pulses.     Heart sounds: Normal heart sounds.  Pulmonary:     Effort: Pulmonary effort is normal.     Breath sounds: Normal breath sounds.  Chest:     Chest wall: No tenderness.  Abdominal:     General: Bowel sounds are normal. There is no distension.     Palpations: Abdomen is soft.     Tenderness: There is no abdominal tenderness.  Genitourinary:    Comments: Deferred pelvic and breast exam to GYN Musculoskeletal:        General: No tenderness. Normal range of motion.     Cervical back: Normal range of motion and neck supple.  Lymphadenopathy:     Cervical: No cervical adenopathy.  Skin:    General: Skin is warm and dry.  Neurological:     Mental Status: She is alert and oriented to person, place, and time.  Psychiatric:        Mood  and Affect: Mood normal.        Behavior: Behavior normal.        Thought Content: Thought content normal.        Judgment: Judgment normal.    ASSESSMENT and PLAN: This visit occurred during the SARS-CoV-2 public health emergency.  Safety protocols were in place, including screening questions prior to the visit, additional usage of staff PPE, and extensive cleaning of exam room while observing appropriate contact  time as indicated for disinfecting solutions.   Leilene was seen today for annual exam.  Diagnoses and all orders for this visit:  Encounter for preventative adult health care exam with abnormal findings -     Cancel: CBC with Differential/Platelet -     Cancel: Comprehensive metabolic panel -     Cancel: Lipid panel; Future -     Cancel: TSH -     Ambulatory referral to Gastroenterology -     CBC with Differential/Platelet -     Comprehensive metabolic panel -     Lipid panel -     TSH  Essential (primary) hypertension -     olmesartan (BENICAR) 20 MG tablet; Take 1 tablet (20 mg total) by mouth daily.  Encounter for lipid screening for cardiovascular disease -     Cancel: Lipid panel; Future -     Lipid panel  Vitamin B12 deficiency -     Cancel: B12 -     B12  Colon cancer screening -     Ambulatory referral to Gastroenterology  Tobacco use disorder, continuous -     varenicline (CHANTIX) 0.5 MG tablet; take 1 tab by mouth  daily x 3 days, then 1 tab twice a day x 7days, then 2 tabs twice a day continuously.  Encounter for tobacco use cessation counseling -     varenicline (CHANTIX) 0.5 MG tablet; take 1 tab by mouth  daily x 3 days, then 1 tab twice a day x 7days, then 2 tabs twice a day continuously.  Vitamin D insufficiency -     Cancel: Vitamin D 1,25 dihydroxy -     Vitamin D 1,25 dihydroxy  Encounter for hepatitis C screening test for low risk patient -     Hepatitis C Antibody        Problem List Items Addressed This Visit       Cardiovascular and Mediastinum   Essential (primary) hypertension    BP at goal BP Readings from Last 3 Encounters:  11/09/20 120/84  10/15/19 128/82  07/21/19 (!) 162/98   Repeat BMP Maintain benicar dose      Relevant Medications   olmesartan (BENICAR) 20 MG tablet     Other   Tobacco abuse disorder    States she is ready to quit Currently smokes 1pack every 3days. No claudication, normal peripheral pulses, no edema Does not have a quit date at this time Unable to quit with nicotine patch alone.  She agreed to start chantix. rx sent Provided printed information on medication F/up in 14month      Relevant Medications   varenicline (CHANTIX) 0.5 MG tablet   Vitamin B12 deficiency    No oral supplement Improved parathesia Repeat B12      Relevant Orders   B12   Vitamin D insufficiency    No oral supplement Repeat Vit. D      Relevant Orders   Vitamin D 1,25 dihydroxy    Other Visit Diagnoses    Encounter for preventative adult health care exam with abnormal findings    -  Primary   Relevant Orders   Ambulatory referral to Gastroenterology   CBC with Differential/Platelet   Comprehensive metabolic panel   Lipid panel   TSH   Encounter for lipid screening for cardiovascular disease       Relevant Orders   Lipid panel   Colon cancer screening       Relevant Orders   Ambulatory referral to Gastroenterology  Encounter for tobacco use cessation counseling       Relevant Medications   varenicline (CHANTIX) 0.5 MG tablet   Encounter for hepatitis C screening test for low risk patient       Relevant Orders   Hepatitis C Antibody      Follow up: Return in about 4 weeks (around 12/07/2020) for tobacco cessation.  Wilfred Lacy, NP

## 2020-11-09 NOTE — Assessment & Plan Note (Signed)
BP at goal BP Readings from Last 3 Encounters:  11/09/20 120/84  10/15/19 128/82  07/21/19 (!) 162/98   Repeat BMP Maintain benicar dose

## 2020-11-09 NOTE — Patient Instructions (Signed)
Go to lab for blood draw  Schedule appt for dental cleaning and annual eye exam. Start daily exercise and maintain DASH diet.  Set quit date 1week prior to starting chantix tabs.  Varenicline oral tablets What is this medicine? VARENICLINE (var e NI kleen) is used to help people quit smoking. It is used with a patient support program recommended by your physician. This medicine may be used for other purposes; ask your health care provider or pharmacist if you have questions. COMMON BRAND NAME(S): Chantix What should I tell my health care provider before I take this medicine? They need to know if you have any of these conditions:  heart disease  if you often drink alcohol  kidney disease  mental illness  on hemodialysis  seizures  history of stroke  suicidal thoughts, plans, or attempt; a previous suicide attempt by you or a family member  an unusual or allergic reaction to varenicline, other medicines, foods, dyes, or preservatives  pregnant or trying to get pregnant  breast-feeding How should I use this medicine? Take this medicine by mouth after eating. Take with a full glass of water. Follow the directions on the prescription label. Take your doses at regular intervals. Do not take your medicine more often than directed. There are 3 ways you can use this medicine to help you quit smoking; talk to your health care professional to decide which plan is right for you: 1) you can choose a quit date and start this medicine 1 week before the quit date, or, 2) you can start taking this medicine before you choose a quit date, and then pick a quit date between day 8 and 35 days of treatment, or, 3) if you are not sure that you are able or willing to quit smoking right away, start taking this medicine and slowly decrease the amount you smoke as directed by your health care professional with the goal of being cigarette-free by week 12 of treatment. Stick to your plan; ask about  support groups or other ways to help you remain cigarette-free. If you are motivated to quit smoking and did not succeed during a previous attempt with this medicine for reasons other than side effects, or if you returned to smoking after this treatment, speak with your health care professional about whether another course of this medicine may be right for you. A special MedGuide will be given to you by the pharmacist with each prescription and refill. Be sure to read this information carefully each time. Talk to your pediatrician regarding the use of this medicine in children. This medicine is not approved for use in children. Overdosage: If you think you have taken too much of this medicine contact a poison control center or emergency room at once. NOTE: This medicine is only for you. Do not share this medicine with others. What if I miss a dose? If you miss a dose, take it as soon as you can. If it is almost time for your next dose, take only that dose. Do not take double or extra doses. What may interact with this medicine?  alcohol  insulin  other medicines used to help people quit smoking  theophylline  warfarin This list may not describe all possible interactions. Give your health care provider a list of all the medicines, herbs, non-prescription drugs, or dietary supplements you use. Also tell them if you smoke, drink alcohol, or use illegal drugs. Some items may interact with your medicine. What should I watch for while  using this medicine? It is okay if you do not succeed at your attempt to quit and have a cigarette. You can still continue your quit attempt and keep using this medicine as directed. Just throw away your cigarettes and get back to your quit plan. Talk to your health care provider before using other treatments to quit smoking. Using this medicine with other treatments to quit smoking may increase the risk for side effects compared to using a treatment alone. You may get  drowsy or dizzy. Do not drive, use machinery, or do anything that needs mental alertness until you know how this medicine affects you. Do not stand or sit up quickly, especially if you are an older patient. This reduces the risk of dizzy or fainting spells. Decrease the number of alcoholic beverages that you drink during treatment with this medicine until you know if this medicine affects your ability to tolerate alcohol. Some people have experienced increased drunkenness (intoxication), unusual or sometimes aggressive behavior, or no memory of things that have happened (amnesia) during treatment with this medicine. Sleepwalking can happen during treatment with this medicine, and can sometimes lead to behavior that is harmful to you, other people, or property. Stop taking this medicine and tell your doctor if you start sleepwalking or have other unusual sleep-related activity. After taking this medicine, you may get up out of bed and do an activity that you do not know you are doing. The next morning, you may have no memory of this. Activities include driving a car ("sleep-driving"), making and eating food, talking on the phone, sexual activity, and sleep-walking. Serious injuries have occurred. Stop the medicine and call your doctor right away if you find out you have done any of these activities. Do not take this medicine if you have used alcohol that evening. Do not take it if you have taken another medicine for sleep. The risk of doing these sleep-related activities is higher. Patients and their families should watch out for new or worsening depression or thoughts of suicide. Also watch out for sudden changes in feelings such as feeling anxious, agitated, panicky, irritable, hostile, aggressive, impulsive, severely restless, overly excited and hyperactive, or not being able to sleep. If this happens, call your health care professional. If you have diabetes and you quit smoking, the effects of insulin may be  increased and you may need to reduce your insulin dose. Check with your doctor or health care professional about how you should adjust your insulin dose. What side effects may I notice from receiving this medicine? Side effects that you should report to your doctor or health care professional as soon as possible:  allergic reactions like skin rash, itching or hives, swelling of the face, lips, tongue, or throat  acting aggressive, being angry or violent, or acting on dangerous impulses  breathing problems  changes in emotions or moods  chest pain or chest tightness  feeling faint or lightheaded, falls  hallucination, loss of contact with reality  mouth sores  redness, blistering, peeling or loosening of the skin, including inside the mouth  signs and symptoms of a stroke like changes in vision; confusion; trouble speaking or understanding; severe headaches; sudden numbness or weakness of the face, arm or leg; trouble walking; dizziness; loss of balance or coordination  seizures  sleepwalking  suicidal thoughts or other mood changes Side effects that usually do not require medical attention (report to your doctor or health care professional if they continue or are bothersome):  constipation  gas  headache  nausea, vomiting  strange dreams  trouble sleeping This list may not describe all possible side effects. Call your doctor for medical advice about side effects. You may report side effects to FDA at 1-800-FDA-1088. Where should I keep my medicine? Keep out of the reach of children. Store at room temperature between 15 and 30 degrees C (59 and 86 degrees F). Throw away any unused medicine after the expiration date. NOTE: This sheet is a summary. It may not cover all possible information. If you have questions about this medicine, talk to your doctor, pharmacist, or health care provider.  2021 Elsevier/Gold Standard (2018-05-24 14:27:36)

## 2020-11-09 NOTE — Assessment & Plan Note (Signed)
No oral supplement Repeat Vit. D

## 2020-11-09 NOTE — Assessment & Plan Note (Addendum)
No oral supplement Improved parathesia Repeat B12

## 2020-11-09 NOTE — Assessment & Plan Note (Addendum)
States she is ready to quit Currently smokes 1pack every 3days. No claudication, normal peripheral pulses, no edema Does not have a quit date at this time Unable to quit with nicotine patch alone.  She agreed to start chantix. rx sent Provided printed information on medication F/up in 70month

## 2020-11-10 ENCOUNTER — Other Ambulatory Visit (HOSPITAL_BASED_OUTPATIENT_CLINIC_OR_DEPARTMENT_OTHER): Payer: Self-pay

## 2020-11-14 LAB — HEPATITIS C ANTIBODY
Hepatitis C Ab: NONREACTIVE
SIGNAL TO CUT-OFF: 0.01 (ref <1.00)

## 2020-11-14 LAB — VITAMIN D 1,25 DIHYDROXY
Vitamin D 1, 25 (OH)2 Total: 47 pg/mL (ref 18–72)
Vitamin D2 1, 25 (OH)2: 9 pg/mL
Vitamin D3 1, 25 (OH)2: 38 pg/mL

## 2020-11-18 ENCOUNTER — Telehealth: Payer: Self-pay | Admitting: Nurse Practitioner

## 2020-11-18 NOTE — Telephone Encounter (Signed)
Patient notified VIA phone.  No questions.  Dm/cma ? ?

## 2020-11-18 NOTE — Telephone Encounter (Signed)
Pt returning call about lab results. Call back [phone]

## 2020-11-24 ENCOUNTER — Encounter: Payer: Self-pay | Admitting: Gastroenterology

## 2021-01-19 ENCOUNTER — Encounter: Payer: 59 | Admitting: Gastroenterology

## 2021-02-03 ENCOUNTER — Ambulatory Visit (AMBULATORY_SURGERY_CENTER): Payer: 59 | Admitting: *Deleted

## 2021-02-03 ENCOUNTER — Other Ambulatory Visit (HOSPITAL_BASED_OUTPATIENT_CLINIC_OR_DEPARTMENT_OTHER): Payer: Self-pay

## 2021-02-03 ENCOUNTER — Other Ambulatory Visit: Payer: Self-pay

## 2021-02-03 VITALS — Ht 63.25 in | Wt 142.0 lb

## 2021-02-03 DIAGNOSIS — Z1211 Encounter for screening for malignant neoplasm of colon: Secondary | ICD-10-CM

## 2021-02-03 MED ORDER — PEG 3350-KCL-NA BICARB-NACL 420 G PO SOLR
4000.0000 mL | Freq: Once | ORAL | 0 refills | Status: AC
Start: 2021-02-03 — End: 2021-02-04
  Filled 2021-02-03: qty 4000, 1d supply, fill #0

## 2021-02-03 NOTE — Progress Notes (Signed)
Virtual pre-visit completed. Instructions sent through [facility]. Rx sent to pharmacy.  No egg or soy allergy known to patient  No issues with past sedation with any surgeries or procedures Patient denies ever being told they had issues or difficulty with intubation  No FH of Malignant Hyperthermia No diet pills per patient No home 02 use per patient  No blood thinners per patient  Pt denies issues with constipation  No A fib or A flutter  EMMI video to pt or via MyChart  Due to the COVID-19 pandemic we are asking patients to follow certain guidelines.  Pt aware of COVID protocols and [facility] guidelines

## 2021-02-16 ENCOUNTER — Encounter: Payer: Self-pay | Admitting: Gastroenterology

## 2021-02-16 ENCOUNTER — Other Ambulatory Visit: Payer: Self-pay

## 2021-02-16 ENCOUNTER — Ambulatory Visit (AMBULATORY_SURGERY_CENTER): Payer: 59 | Admitting: Gastroenterology

## 2021-02-16 VITALS — BP 123/74 | HR 54 | Temp 98.1°F | Resp 13 | Ht 63.25 in | Wt 142.0 lb

## 2021-02-16 DIAGNOSIS — Z1211 Encounter for screening for malignant neoplasm of colon: Secondary | ICD-10-CM | POA: Diagnosis present

## 2021-02-16 DIAGNOSIS — D124 Benign neoplasm of descending colon: Secondary | ICD-10-CM

## 2021-02-16 DIAGNOSIS — Z800 Family history of malignant neoplasm of digestive organs: Secondary | ICD-10-CM

## 2021-02-16 DIAGNOSIS — K640 First degree hemorrhoids: Secondary | ICD-10-CM

## 2021-02-16 DIAGNOSIS — K5730 Diverticulosis of large intestine without perforation or abscess without bleeding: Secondary | ICD-10-CM

## 2021-02-16 MED ORDER — SODIUM CHLORIDE 0.9 % IV SOLN: 500.0000 mL | Freq: Once | INTRAVENOUS | Status: DC

## 2021-02-16 NOTE — Patient Instructions (Signed)
Discharge instructions given. Handouts on polyps,diverticulosis and Hemorrhoids. Resume previous medications. YOU HAD AN ENDOSCOPIC PROCEDURE TODAY AT THE [facility]:   Refer to the procedure report that was given to you for any specific questions about what was found during the examination.  If the procedure report does not answer your questions, please call your gastroenterologist to clarify.  If you requested that your care partner not be given the details of your procedure findings, then the procedure report has been included in a sealed envelope for you to review at your convenience later.  YOU SHOULD EXPECT: Some feelings of bloating in the abdomen. Passage of more gas than usual.  Walking can help get rid of the air that was put into your GI tract during the procedure and reduce the bloating. If you had a lower endoscopy (such as a colonoscopy or flexible sigmoidoscopy) you may notice spotting of blood in your stool or on the toilet paper. If you underwent a bowel prep for your procedure, you may not have a normal bowel movement for a few days.  Please Note:  You might notice some irritation and congestion in your nose or some drainage.  This is from the oxygen used during your procedure.  There is no need for concern and it should clear up in a day or so.  SYMPTOMS TO REPORT IMMEDIATELY:  Following lower endoscopy (colonoscopy or flexible sigmoidoscopy):  Excessive amounts of blood in the stool  Significant tenderness or worsening of abdominal pains  Swelling of the abdomen that is new, acute  Fever of 100F or higher   For urgent or emergent issues, a gastroenterologist can be reached at any hour by calling [phone]. Do not use MyChart messaging for urgent concerns.    DIET:  We do recommend a small meal at first, but then you may proceed to your regular diet.  Drink plenty of fluids but you should avoid alcoholic beverages for 24 hours.  ACTIVITY:  You should  plan to take it easy for the rest of today and you should NOT DRIVE or use heavy machinery until tomorrow (because of the sedation medicines used during the test).    FOLLOW UP: Our staff will call the number listed on your records 48-72 hours following your procedure to check on you and address any questions or concerns that you may have regarding the information given to you following your procedure. If we do not reach you, we will leave a message.  We will attempt to reach you two times.  During this call, we will ask if you have developed any symptoms of COVID 19. If you develop any symptoms (ie: fever, flu-like symptoms, shortness of breath, cough etc.) before then, please call [phone].  If you test positive for Covid 19 in the 2 weeks post procedure, please call and report this information to us.    If any biopsies were taken you will be contacted by phone or by letter within the next 1-3 weeks.  Please call us at [phone] if you have not heard about the biopsies in 3 weeks.    SIGNATURES/CONFIDENTIALITY: You and/or your care partner have signed paperwork which will be entered into your electronic medical record.  These signatures attest to the fact that that the information above on your After Visit Summary has been reviewed and is understood.  Full responsibility of the confidentiality of this discharge information lies with you and/or your care-partner.   

## 2021-02-16 NOTE — Progress Notes (Signed)
Called to room to assist during endoscopic procedure.  Patient ID and intended procedure confirmed with present staff. Received instructions for my participation in the procedure from the performing physician.  

## 2021-02-16 NOTE — Progress Notes (Signed)
GASTROENTEROLOGY PROCEDURE H&P NOTE   Primary Care Physician: Flossie Buffy, NP    Reason for Procedure:   Colon cancer screening  Plan:    Colonoscopy  Patient is appropriate for endoscopic procedure(s) in the ambulatory ([facility]) setting.  The nature of the procedure, as well as the risks, benefits, and alternatives were carefully and thoroughly reviewed with the patient. Ample time for discussion and questions allowed. The patient understood, was satisfied, and agreed to proceed.     HPI: Emily Baker is a 51 y.o. female who presents for colonoscopy for initial colon cancer screening. No active GI sxs. Fhx n/f maternal grandfather and mother (diagnosed early 9's) with Colon Cancer.    Past Medical History:  Diagnosis Date   Family history of ovarian cancer    Patient's mother   Hx of migraine headaches    Hypertension     Past Surgical History:  Procedure Laterality Date   BREAST BIOPSY Right    BREAST EXCISIONAL BIOPSY Right 2019   ENDOMETRIAL ABLATION  11/2007   FOOT SURGERY Bilateral    LAPAROSCOPY     X 3- Hx endometriosis per patient   TUBAL LIGATION      Prior to Admission medications   Medication Sig Start Date End Date Taking? Authorizing Provider  olmesartan (BENICAR) 20 MG tablet Take 1 tablet (20 mg total) by mouth daily. 11/09/20  Yes Nche, Charlene Brooke, NP  Elagolix Sodium (ORILISSA) 150 MG TABS Take 1 capsule by mouth daily. 04/30/19   Lavonia Drafts, MD  varenicline (CHANTIX) 0.5 MG tablet take 1 tab by mouth  daily x 3 days, then 1 tab twice a day x 7days, then 2 tabs twice a day continuously. Patient not taking: No sig reported 11/09/20   Nche, Charlene Brooke, NP    Current Outpatient Medications  Medication Sig Dispense Refill   olmesartan (BENICAR) 20 MG tablet Take 1 tablet (20 mg total) by mouth daily. 90 tablet 3   Elagolix Sodium (ORILISSA) 150 MG TABS Take 1 capsule by mouth daily. 30 tablet 6   varenicline (CHANTIX)  0.5 MG tablet take 1 tab by mouth  daily x 3 days, then 1 tab twice a day x 7days, then 2 tabs twice a day continuously. (Patient not taking: No sig reported) 180 tablet 2   Current Facility-Administered Medications  Medication Dose Route Frequency Provider Last Rate Last Admin   0.9 %  sodium chloride infusion  500 mL Intravenous Once Linzie Criss V, DO        Allergies as of 02/16/2021   (No Known Allergies)    Family History  Problem Relation Age of Onset   Colon cancer Mother    Ovarian cancer Mother    Hypertension Mother    Cancer Mother        Ovarian cancer   Hyperlipidemia Brother    Hypertension Brother    Hyperlipidemia Brother    Hypertension Maternal Grandfather    Colon cancer Maternal Grandfather 60   Diabetes Mellitus II Paternal Grandmother    Healthy Child        x 4   Esophageal cancer Neg Hx    Stomach cancer Neg Hx    Rectal cancer Neg Hx     Social History   Socioeconomic History   Marital status: Married    Spouse name: Tyrone   Number of children: 4   Years of education: Not on file   Highest education level: Not on file  Occupational  History   Occupation: Press photographer  Tobacco Use   Smoking status: Every Day    Packs/day: 0.50    Types: Cigarettes   Smokeless tobacco: Never  Vaping Use   Vaping Use: Never used  Substance and Sexual Activity   Alcohol use: Yes    Alcohol/week: 0.0 standard drinks    Comment: occasional   Drug use: No   Sexual activity: Yes    Partners: Male    Birth control/protection: Surgical    Comment: uterine ablation  Other Topics Concern   Not on file  Social History Narrative   Not on file   Social Determinants of Health   Financial Resource Strain: Not on file  Food Insecurity: Not on file  Transportation Needs: Not on file  Physical Activity: Not on file  Stress: Not on file  Social Connections: Not on file  Intimate Partner Violence: Not on file    Physical Exam: Vital signs in last 24  hours: '@BP'$  134/80 (Patient Position: Sitting)   Pulse 83   Temp 98.1 F (36.7 C)   Resp (!) 100   Ht 5' 3.25" (1.607 m)   Wt 142 lb (64.4 kg)   BMI 24.96 kg/m  GEN: NAD EYE: Sclerae anicteric ENT: MMM CV: Non-tachycardic Pulm: CTA b/l GI: Soft, NT/ND NEURO:  Alert & Oriented x 3   Gerrit Heck, DO [facility]   02/16/2021 8:34 AM

## 2021-02-16 NOTE — Progress Notes (Signed)
Pt's states no medical or surgical changes since previsit or office visit.   Check-in-aer  V/s-cw

## 2021-02-16 NOTE — Progress Notes (Signed)
A and O x3. Report to RN. Tolerated MAC anesthesia well. 

## 2021-02-16 NOTE — Op Note (Signed)
[facility] Patient Name: Emily Baker Procedure Date: 02/16/2021 8:34 AM MRN: [id number] Endoscopist: Gerrit Heck , MD Age: 50 Referring MD:  Date of Birth: Jun 30, 1970 Gender: Female Account #: [id number] Procedure:                Colonoscopy Indications:              Screening in patient at increased risk.                           Family history notable for Colorectal cancer in                            mother, diagnosed in her early 23's, and maternal                            grandfather. She is otherwise without GI symptoms.                            This is the patient's first colonoscopy Medicines:                Monitored Anesthesia Care Procedure:                Pre-Anesthesia Assessment:                           - Prior to the procedure, a History and Physical                            was performed, and patient medications and                            allergies were reviewed. The patient's tolerance of                            previous anesthesia was also reviewed. The risks                            and benefits of the procedure and the sedation                            options and risks were discussed with the patient.                            All questions were answered, and informed consent                            was obtained. Prior Anticoagulants: The patient has                            taken no previous anticoagulant or antiplatelet                            agents. ASA Grade Assessment: II - A patient with  mild systemic disease. After reviewing the risks                            and benefits, the patient was deemed in                            satisfactory condition to undergo the procedure.                           After obtaining informed consent, the colonoscope                            was passed under direct vision. Throughout the                            procedure, the patient's blood  pressure, pulse, and                            oxygen saturations were monitored continuously. The                            CF [id number] was introduced through the anus                            and advanced to the the cecum, identified by                            appendiceal orifice and ileocecal valve. The                            colonoscopy was performed without difficulty. The                            patient tolerated the procedure well. The quality                            of the bowel preparation was good. The ileocecal                            valve, appendiceal orifice, and rectum were                            photographed. Scope In: 8:43:17 AM Scope Out: 8:56:37 AM Scope Withdrawal Time: 0 hours 10 minutes 59 seconds  Total Procedure Duration: 0 hours 13 minutes 20 seconds  Findings:                 The perianal and digital rectal examinations were                            normal.                           A 4 mm polyp was found in the descending colon. The  polyp was sessile. The polyp was removed with a                            cold snare. Resection and retrieval were complete.                            Estimated blood loss was minimal.                           Multiple small and large-mouthed diverticula were                            found in the sigmoid colon and ascending colon.                           Non-bleeding internal hemorrhoids were found during                            retroflexion. The hemorrhoids were small and Grade                            I (internal hemorrhoids that do not prolapse). Complications:            No immediate complications. Estimated Blood Loss:     Estimated blood loss was minimal. Impression:               - One 4 mm polyp in the descending colon, removed                            with a cold snare. Resected and retrieved.                           - Diverticulosis in the sigmoid  colon and in the                            ascending colon.                           - Non-bleeding internal hemorrhoids. Recommendation:           - Patient has a contact number available for                            emergencies. The signs and symptoms of potential                            delayed complications were discussed with the                            patient. Return to normal activities tomorrow.                            Written discharge instructions were provided to the  patient.                           - Resume previous diet.                           - Continue present medications.                           - Await pathology results.                           - Repeat colonoscopy in 5 years for surveillance                            and due to family history.                           - Return to GI office PRN.                           - Use fiber, for example Citrucel, Fibercon, Konsyl                            or Metamucil.                           - Internal hemorrhoids were noted on this study and                            may be amenable to hemorrhoid band ligation. If you                            are interested in further treatment of these                            hemorrhoids with band ligation, please contact my                            clinic to set up an appointment for evaluation and                            treatment. Gerrit Heck, MD 02/16/2021 9:02:28 AM

## 2021-02-18 ENCOUNTER — Telehealth: Payer: Self-pay

## 2021-02-18 NOTE — Telephone Encounter (Signed)
  Follow up Call-  Call back number 02/16/2021  Post procedure Call Back phone  # [phone]  Permission to leave phone message Yes  Some recent data might be hidden     Patient questions:  Do you have a fever, pain , or abdominal swelling? No. Pain Score  0 *  Have you tolerated food without any problems? Yes.    Have you been able to return to your normal activities? Yes.    Do you have any questions about your discharge instructions: Diet   No. Medications  No. Follow up visit  No.  Do you have questions or concerns about your Care? No.  Actions: * If pain score is 4 or above: No action needed, pain <4.  Have you developed a fever since your procedure? no  2.   Have you had an respiratory symptoms (SOB or cough) since your procedure? no  3.   Have you tested positive for COVID 19 since your procedure no  4.   Have you had any family members/close contacts diagnosed with the COVID 19 since your procedure?  no   If yes to any of these questions please route to Joylene John, RN and Joella Prince, RN

## 2021-02-25 ENCOUNTER — Encounter: Payer: Self-pay | Admitting: Gastroenterology

## 2021-03-01 ENCOUNTER — Other Ambulatory Visit (HOSPITAL_BASED_OUTPATIENT_CLINIC_OR_DEPARTMENT_OTHER): Payer: Self-pay

## 2021-03-10 ENCOUNTER — Other Ambulatory Visit (HOSPITAL_BASED_OUTPATIENT_CLINIC_OR_DEPARTMENT_OTHER): Payer: Self-pay

## 2021-04-06 ENCOUNTER — Other Ambulatory Visit (HOSPITAL_BASED_OUTPATIENT_CLINIC_OR_DEPARTMENT_OTHER): Payer: Self-pay

## 2021-04-11 ENCOUNTER — Encounter: Payer: Self-pay | Admitting: Obstetrics & Gynecology

## 2021-04-11 ENCOUNTER — Other Ambulatory Visit (HOSPITAL_COMMUNITY)
Admission: RE | Admit: 2021-04-11 | Discharge: 2021-04-11 | Disposition: A | Discharge status: Home / Self Care | Payer: 59 | Source: Ambulatory Visit | Attending: Obstetrics & Gynecology | Admitting: Obstetrics & Gynecology

## 2021-04-11 ENCOUNTER — Ambulatory Visit (INDEPENDENT_AMBULATORY_CARE_PROVIDER_SITE_OTHER): Payer: 59 | Admitting: Obstetrics & Gynecology

## 2021-04-11 ENCOUNTER — Other Ambulatory Visit: Payer: Self-pay

## 2021-04-11 ENCOUNTER — Telehealth: Payer: Self-pay | Admitting: General Practice

## 2021-04-11 VITALS — BP 125/83 | HR 80 | Wt 154.0 lb

## 2021-04-11 DIAGNOSIS — Z01419 Encounter for gynecological examination (general) (routine) without abnormal findings: Secondary | ICD-10-CM | POA: Insufficient documentation

## 2021-04-11 DIAGNOSIS — N951 Menopausal and female climacteric states: Secondary | ICD-10-CM | POA: Diagnosis not present

## 2021-04-11 DIAGNOSIS — R232 Flushing: Secondary | ICD-10-CM | POA: Diagnosis not present

## 2021-04-11 NOTE — Patient Instructions (Signed)
Perimenopause Perimenopause is the normal time of a woman's life when the levels of estrogen, the female hormone produced by the ovaries, begin to decrease. This leads to changes in menstrual periods before they stop completely (menopause). Perimenopause can begin 2-8 years before menopause. During perimenopause, the ovaries may or may not produce an egg and a woman can still become pregnant. What are the causes? This condition is caused by a natural change in hormone levels that happens as you get older. What increases the risk? This condition is more likely to start at an earlier age if you have certain medical conditions or have undergone treatments, including: A tumor of the pituitary gland in the brain. A disease that affects the ovaries and hormone production. Certain cancer treatments, such as chemotherapy or hormone therapy, or radiation therapy on the pelvis. Heavy smoking and excessive alcohol use. Family history of early menopause. What are the signs or symptoms? Perimenopausal changes affect each woman differently. Symptoms of this condition may include: Hot flashes. Irregular menstrual periods. Night sweats. Changes in feelings about sex. This could be a decrease in sex drive or an increased discomfort around your sexuality. Vaginal dryness. Headaches. Mood swings. Depression. Problems sleeping (insomnia). Memory problems or trouble concentrating. Irritability. Tiredness. Weight gain. Anxiety. Trouble getting pregnant. How is this diagnosed? This condition is diagnosed based on your medical history, a physical exam, your age, your menstrual history, and your symptoms. Hormone tests may also be done. How is this treated? In some cases, no treatment is needed. You and your health care provider should make a decision together about whether treatment is necessary. Treatment will be based on your individual condition and preferences. Various treatments are available, such  as: Menopausal hormone therapy (MHT). Medicines to treat specific symptoms. Acupuncture. Vitamin or herbal supplements. Before starting treatment, make sure to let your health care provider know if you have a personal or family history of: Heart disease. Breast cancer. Blood clots. Diabetes. Osteoporosis. Follow these instructions at home: Medicines Take over-the-counter and prescription medicines only as told by your health care provider. Take vitamin supplements only as told by your health care provider. Talk with your health care provider before starting any herbal supplements. Lifestyle  Do not use any products that contain nicotine or tobacco, such as cigarettes, e-cigarettes, and chewing tobacco. If you need help quitting, ask your health care provider. Get at least 30 minutes of physical activity on 5 or more days each week. Eat a balanced diet that includes fresh fruits and vegetables, whole grains, soybeans, eggs, lean meat, and low-fat dairy. Avoid alcoholic and caffeinated beverages, as well as spicy foods. This may help prevent hot flashes. Get 7-8 hours of sleep each night. Dress in layers that can be removed to help you manage hot flashes. Find ways to manage stress, such as deep breathing, meditation, or journaling. General instructions  Keep track of your menstrual periods, including: When they occur. How heavy they are and how long they last. How much time passes between periods. Keep track of your symptoms, noting when they start, how often you have them, and how long they last. Use vaginal lubricants or moisturizers to help with vaginal dryness and improve comfort during sex. You can still become pregnant if you are having irregular periods. Make sure you use contraception during perimenopause if you do not want to get pregnant. Keep all follow-up visits. This is important. This includes any group therapy or counseling. Contact a health care provider if: You  have   heavy vaginal bleeding or pass blood clots. Your period lasts more than 2 days longer than normal. Your periods are recurring sooner than 21 days. You bleed after having sex. You have pain during sex. Get help right away if you have: Chest pain, trouble breathing, or trouble talking. Severe depression. Pain when you urinate. Severe headaches. Vision problems. Summary Perimenopause is the time when a woman's body begins to move into menopause. This may happen naturally or as a result of other health problems or medical treatments. Perimenopause can begin 2-8 years before menopause, and it can last for several years. Perimenopausal symptoms can be managed through medicines, lifestyle changes, and complementary therapies such as acupuncture. This information is not intended to replace advice given to you by your health care provider. Make sure you discuss any questions you have with your health care provider. Document Revised: 11/20/2019 Document Reviewed: 11/20/2019 Elsevier Patient Education  [place] or Tablets What is this medication? MELATONIN (mel uh TOH nin) is promoted for sleep disorders, such as insomnia or jet lag. Melatonin helps regulate your sleep cycle. This supplement is not intended to diagnose, treat, cure, or prevent any disease. This medicine may be used for other purposes; ask your health care provider or pharmacist if you have questions. COMMON BRAND NAME(S): Melatonex What should I tell my care team before I take this medication? They need to know if you have any of these conditions: Cancer Depression or mental illness Diabetes Hormone problems If you often drink alcohol Immune system problems Liver disease Lung or breathing disease, like asthma Organ transplant Seizure disorder An unusual or allergic reaction to melatonin, other medications, foods, dyes, or preservatives Pregnant or trying to get pregnant Breast-feeding How  should I use this medication? Take this medication by mouth with a glass of water. Do not take with food. This medication is usually taken 1 or 2 hours before bedtime. After taking this medication, limit your activities to those needed to prepare for bed. Some products may be chewed or dissolved in the mouth before swallowing. Some tablets or capsules must be swallowed whole; do not cut, crush or chew. Follow the directions on the package labeling, or take as directed by your care team. Do not take this medication more often than directed. Talk to your care team the use of this medication in children. Special care may be needed. This medication is not recommended for use in children without a prescription. Overdosage: If you think you have taken too much of this medicine contact a poison control center or emergency room at once. NOTE: This medicine is only for you. Do not share this medicine with others. What if I miss a dose? If you miss taking your dose at the usual time, skip that dose. If it is almost time for your next dose, take only that dose. Do not take double or extra doses. What may interact with this medication? Do not take this medication with any of the following: Fluvoxamine Ramelteon Tasimelteon This medication may also interact with the following: Alcohol Caffeine Carbamazepine Certain antibiotics like ciprofloxacin Certain medications for depression, anxiety, or psychotic disturbances Cimetidine Female hormones, like estrogens and birth control pills, patches, rings, or injections Methoxsalen Nifedipine Other herbal or dietary supplements Other medications for sleep Phenobarbital Rifampin Smoking tobacco Tamoxifen Warfarin This list may not describe all possible interactions. Give your health care provider a list of all the medicines, herbs, non-prescription drugs, or dietary supplements you use. Also tell them if  you smoke, drink alcohol, or use illegal drugs. Some  items may interact with your medicine. What should I watch for while using this medication? See your care team if your symptoms do not get better or if they get worse. Do not take this medication for more than 2 weeks unless your care team tells you to. You may get drowsy or dizzy. Do not drive, use machinery, or do anything that needs mental alertness until you know how this medication affects you. Do not stand or sit up quickly, especially if you are an older patient. This reduces the risk of dizzy or fainting spells. Alcohol may interfere with the effect of this medication. Avoid alcoholic drinks. After taking this medication, you may get up out of bed and do an activity that you do not know you are doing. The next morning, you may have no memory of this. Activities include driving a car ("sleep-driving"), making and eating food, talking on the phone, sexual activity, and sleep-walking. Serious injuries have occurred. Call your care team right away if you find out you have done any of these activities. Do not take this medication if you have used alcohol that evening. Do not take it if you have taken another medication for sleep. The risk of doing these sleep-related activities is higher. Talk to your care team before you use this medication if you are currently being treated for an emotional, mental, or sleep problem. This medication may interfere with your treatment. Herbal or dietary supplements are not regulated like medications. Rigid quality control standards are not required for dietary supplements. The purity and strength of these products can vary. The safety and effect of this dietary supplement for a certain disease or illness is not well known. This product is not intended to diagnose, treat, cure or prevent any disease. The Food and Drug Administration suggests the following to help consumers protect themselves: Always read product labels and follow directions. Natural does not mean a  product is safe for humans to take. Look for products that include USP after the ingredient name. This means that the manufacturer followed the standards of the [place] Pharmacopoeia. Supplements made or sold by a nationally known food or drug company are more likely to be made under tight controls. You can write to the company for more information about how the product was made. What side effects may I notice from receiving this medication? Side effects that you should report to your care team as soon as possible: Allergic reactions-skin rash, itching, hives, swelling of the face, lips, tongue, or throat Mood and behavior changes-anxiety, nervousness, confusion, hallucinations, irritability, hostility, thoughts of suicide or self-harm, worsening mood, feelings of depression Side effects that usually do not require medical attention (report to your care team if they continue or are bothersome): Bedwetting in children Dizziness Drowsiness the day after use Headache Nausea This list may not describe all possible side effects. Call your doctor for medical advice about side effects. You may report side effects to FDA at 1-800-FDA-1088. Where should I keep my medication? Keep out of the reach of children. Store at room temperature or as directed on the package label. Protect from moisture. Throw away any unused medication after the expiration date. NOTE: This sheet is a summary. It may not cover all possible information. If you have questions about this medicine, talk to your doctor, pharmacist, or health care provider.  2022 Elsevier/Gold Standard (2020-09-16 16:37:26)

## 2021-04-11 NOTE — Telephone Encounter (Signed)
Patient informed of [facility] insurance will not be in-network with our practice at the beginning of the year 2023 and verbalized understanding.

## 2021-04-11 NOTE — Progress Notes (Signed)
Subjective:     Emily Baker is a 50 y.o. female here for a routine exam.  Current complaints: pt reports hot flushes and insomnia. She drinks about 1 cup of coffee in the am and then mostly herbal tea. The hot flushes are mainly at night. NO other sx. Her LMP was in 2009 following a thermal ablation.   Pts mother had colon cancer. There was a mass that involved the ovary as well. The pt does not know what the primary was.        Gynecologic History No LMP recorded. Patient has had an ablation. Contraception: tubal ligation Last Pap: 08/14/2018. Results were: normal Last mammogram: 08/02/2020. Results were: normal  Obstetric History OB History  Gravida Para Term Preterm AB Living  4 4          SAB IAB Ectopic Multiple Live Births               # Outcome Date GA Lbr Len/2nd Weight Sex Delivery Anes PTL Lv  4 Para           3 Para           2 Para           1 Para             The following portions of the patient's history were reviewed and updated as appropriate: allergies, current medications, past family history, past medical history, past social history, past surgical history, and problem list.  Review of Systems Pertinent items are noted in HPI.    Objective:  BP 125/83   Pulse 80   Wt 154 lb (69.9 kg)   BMI 27.06 kg/m   General Appearance:    Alert, cooperative, no distress, appears stated age  Head:    Normocephalic, without obvious abnormality, atraumatic  Eyes:    conjunctiva/corneas clear, EOM's intact, both eyes  Ears:    Normal external ear canals, both ears  Nose:   Nares normal, septum midline, mucosa normal, no drainage    or sinus tenderness  Throat:   Lips, mucosa, and tongue normal; teeth and gums normal  Neck:   Supple, symmetrical, trachea midline, no adenopathy;    thyroid:  no enlargement/tenderness/nodules  Back:     Symmetric, no curvature, ROM normal, no CVA tenderness  Lungs:     respirations unlabored  Chest Wall:    No tenderness or deformity    Heart:    Regular rate and rhythm  Breast Exam:    No tenderness, masses, or nipple abnormality  Abdomen:     Soft, non-tender, bowel sounds active all four quadrants,    no masses, no organomegaly  Genitalia:    Normal female without lesion, discharge or tenderness     Extremities:   Extremities normal, atraumatic, no cyanosis or edema  Pulses:   2+ and symmetric all extremities  Skin:   Skin color, texture, turgor normal, no rashes or lesions     Assessment:    Healthy female exam.  Hot flushes. Suspect perimenopause.    Plan:   [name] was seen today for annual exam.  Diagnoses and all orders for this visit:  Well female exam with routine gynecological exam -     Cytology - PAP( [facility]) -     MM DIGITAL SCREENING BILATERAL; Future  Hot flashes -     FSH -     TSH  Perimenopause  Insomnia associated with menopause   Melatonin prn  Reviewed conservative option for management of hot flushes. Pt will call if she decides on pharmacologic management.   Carlester Kasparek L. Harraway-Smith, M.D., Cherlynn June

## 2021-04-12 LAB — CYTOLOGY - PAP
Comment: NEGATIVE
Diagnosis: NEGATIVE
High risk HPV: NEGATIVE

## 2021-04-12 LAB — TSH: TSH: 0.942 u[IU]/mL (ref 0.450–4.500)

## 2021-04-12 LAB — FOLLICLE STIMULATING HORMONE: FSH: 58.9 m[IU]/mL

## 2021-05-14 ENCOUNTER — Telehealth: Payer: 59 | Admitting: Emergency Medicine

## 2021-05-14 DIAGNOSIS — A6004 Herpesviral vulvovaginitis: Secondary | ICD-10-CM

## 2021-05-15 MED ORDER — VALACYCLOVIR HCL 500 MG PO TABS: 500.0000 mg | ORAL_TABLET | Freq: Two times a day (BID) | ORAL | 0 refills | Status: AC

## 2021-05-15 NOTE — Progress Notes (Signed)
E-Visit for Herpes Simplex  We are sorry that you are not feeling well.  Here is how we plan to help!  Based on what you have shared ith me, it looks like you may be having an outbreak/flare-up of genital herpes.    I have prescribed I have prescribed Valacyclovir 500 mg Take one by mouth twice a day for 3 days.    If you have been prescribed long term medications to be taken on a regular basis, it is important to follow the recommendations and take them as ordered.    Outbreaks usually include blisters and open sores in the genital area. Outbreaks that happen after the first time are usually not as severe and do not last as long. Genital Herpes Simplex is a commonly sexually transmitted viral infection that is found worldwide. Most of these genital infections are caused by one or two herpes simplex viruses that is passed from person to person during vaginal, oral, or anal sex. Sometimes, people do not know they have herpes because they do not have any symptoms.  Please be aware that if you have genital herpes you can be contagious even when you are not having rash or flare-up and you may not have any symptoms, even when you are taking suppressive medicines.  Herpes cannot be cured. The disease usually causes most problems during the first few years. After that, the virus is still there, but it causes few to no symptoms. Even when the virus is active, people with herpes can take medicines to reduce and help prevent symptoms.  Herpes is an infection that can cause blisters and open sores on the genital area. Herpes is caused by a virus that is passed from person to person during vaginal, oral, or anal sex. Sometimes, people do not know they have herpes because they do not have any symptoms. Herpes cannot be cured. The disease usually causes most problems during the first few years. After that, the virus is still there, but it causes few to no symptoms. Even when the virus is active, people with herpes  can take medicines to reduce and help prevent symptoms.  If you have been prescribed medications to be taken on a regular basis, it is important to follow the recommendations and take them as ordered.  Some people with herpes never have any symptoms. But other people can develop symptoms within a few weeks of being infected with the herpes virus   Symptoms usually include blisters in the genital area. In women, this area includes the vagina, buttocks, anus, or thighs. In men, this area includes the penis, scrotum, anus, butt, or thighs. The blisters can become painful open sores, which then crust over as they heal. Sometimes, people can have other symptoms that include:  ?Blisters on the mouth or lips ?Fever, headache, or pain in the joints ?Trouble urinating  Outbreaks might occur every month or more often, or just once or twice a year. Sometimes, people can tell when an outbreak will occur, because they feel itching or pain beforehand. Sometimes they do not know that an outbreak is coming because they have no symptoms. Whatever your pattern is, keep in mind that herpes outbreaks usually become less frequent over time as you get older. Certain things, called "triggers," can make outbreaks more likely to occur. These include stress, sunlight, menstrual periods,or getting sick.  Antiviral therapy can shorten the duration of symptoms and signs in primary infection, which, when untreated, can be associated with significant increase in the  symptoms of the disease.  HOME CARE Use a portable bath (such as a "Sitz bath") where you can sit in warm water for about 20 minutes. Your bathtub could also work. Avoid bubble baths.  Keep the genital area clean and dry and avoid tight clothes.  Take over-the-counter pain medicine such as acetaminophen (brand name: Tylenol) or ibuprofen sample brand names: Advil, Motrin). But avoid aspirin.  Only take medications as instructed by your medical team.  You are  most likely to spread herpes to a sex partner when you have blisters and open sores on your body. But it's also possible to spread herpes to your partner when you do not have any symptoms. That is because herpes can be present on your body without causing any symptoms, like blisters or pain.  Telling your sex partner that you have herpes can be hard. But it can help protect them, since there are ways to lower the risk of spreading the infection.   Using a condom every time you have sex  Not having sex when you have symptoms  Not having oral sex if you have blisters or open sores (in the genital area or around your mouth)  MAKE SURE YOU   Understand these instructions. Do not have sex without using a condom until you have been seen by a doctor and as instructed by the provider If you are not better or improved within 7 days, you MUST have a follow up at your doctor or the health department for evaluation. There are other causes of rashes in the genital region.  Thank you for choosing an e-visit.  Your e-visit answers were reviewed by a board certified advanced clinical practitioner to complete your personal care plan. Depending upon the condition, your plan could have included both over the counter or prescription medications.  Please review your pharmacy choice. Make sure the pharmacy is open so you can pick up prescription now. If there is a problem, you may contact your provider through MyChart messaging and have the prescription routed to another pharmacy.  Your safety is important to us. If you have drug allergies check your prescription carefully.   For the next 24 hours you can use MyChart to ask questions about today's visit, request a non-urgent call back, or ask for a work or school excuse. You will get an email in the next two days asking about your experience. I hope that your e-visit has been valuable and will speed your recovery.      

## 2021-05-15 NOTE — Progress Notes (Signed)
I have spent 5 minutes in review of e-visit questionnaire, review and updating patient chart, medical decision making and response to patient.   Faelynn Wynder, PA-C    

## 2021-05-17 ENCOUNTER — Encounter: Payer: Self-pay | Admitting: Nurse Practitioner

## 2021-05-17 ENCOUNTER — Ambulatory Visit (INDEPENDENT_AMBULATORY_CARE_PROVIDER_SITE_OTHER): Payer: 59 | Admitting: Nurse Practitioner

## 2021-05-17 ENCOUNTER — Other Ambulatory Visit: Payer: Self-pay

## 2021-05-17 VITALS — BP 110/60 | HR 71 | Temp 97.2°F | Ht 63.25 in | Wt 148.6 lb

## 2021-05-17 DIAGNOSIS — Z23 Encounter for immunization: Secondary | ICD-10-CM | POA: Diagnosis not present

## 2021-05-17 DIAGNOSIS — G5603 Carpal tunnel syndrome, bilateral upper limbs: Secondary | ICD-10-CM

## 2021-05-17 NOTE — Progress Notes (Signed)
Subjective:  Patient ID: Emily Baker, female    DOB: [birth year]  Age: 50 y.o. MRN: [id number]  CC: Acute Visit (Pt states she has been having cramping in her hands x 3 weeks. Pt states she has also noticed knots on her right middle finger and there was one on the left but it went away. Pt has tried otc medication (Aleve) with no relief. )  Hand Pain  The incident occurred more than 1 week ago. The incident occurred at work. The injury mechanism was repetitive motion. The pain is present in the left wrist, right fingers, left fingers and right wrist. The quality of the pain is described as aching. The pain does not radiate. The pain is moderate. The pain has been Intermittent since the incident. Pertinent negatives include no chest pain, muscle weakness, numbness or tingling. The symptoms are aggravated by movement. She has tried rest for the symptoms. The treatment provided mild relief.   No problem-specific Assessment & Plan notes found for this encounter.   Reviewed past Medical, Social and Family history today.  Outpatient Medications Prior to Visit  Medication Sig Dispense Refill   olmesartan (BENICAR) 20 MG tablet Take 1 tablet (20 mg total) by mouth daily. 90 tablet 3   valACYclovir (VALTREX) 500 MG tablet Take 1 tablet (500 mg total) by mouth 2 (two) times daily for 3 days. 6 tablet 0   Elagolix Sodium (ORILISSA) 150 MG TABS Take 1 capsule by mouth daily. (Patient not taking: Reported on 04/11/2021) 30 tablet 6   varenicline (CHANTIX) 0.5 MG tablet take 1 tab by mouth  daily x 3 days, then 1 tab twice a day x 7days, then 2 tabs twice a day continuously. (Patient not taking: Reported on 02/03/2021) 180 tablet 2   No facility-administered medications prior to visit.    ROS See HPI  Objective:  BP 110/60 (BP Location: Left Arm, Patient Position: Sitting, Cuff Size: Normal)   Pulse 71   Temp (!) 97.2 F (36.2 C) (Temporal)   Ht 5' 3.25" (1.607 m)   Wt 148 lb 9.6 oz (67.4 kg)    SpO2 98%   BMI 26.12 kg/m   Physical Exam Cardiovascular:     Pulses: Normal pulses.  Pulmonary:     Effort: Pulmonary effort is normal.  Musculoskeletal:        General: Tenderness present. No swelling or deformity.     Right elbow: Normal.     Left elbow: Normal.     Right forearm: Normal.     Left forearm: Normal.     Right wrist: Tenderness present. No swelling, deformity, effusion, bony tenderness, snuff box tenderness or crepitus. Normal range of motion. Normal pulse.     Left wrist: Tenderness present. No swelling, deformity, effusion, bony tenderness, snuff box tenderness or crepitus. Normal range of motion. Normal pulse.     Right hand: Normal.     Left hand: Normal.     Comments: Positive phalen test  Neurological:     Mental Status: She is alert and oriented to person, place, and time.   Assessment & Plan:  This visit occurred during the SARS-CoV-2 public health emergency.  Safety protocols were in place, including screening questions prior to the visit, additional usage of staff PPE, and extensive cleaning of exam room while observing appropriate contact time as indicated for disinfecting solutions.   Emily Baker was seen today for acute visit.  Diagnoses and all orders for this visit:  Carpal tunnel syndrome on  both sides  Flu vaccine need -     Flu Vaccine QUAD 6+ mos PF IM (Fluarix Quad PF) Use wrist brace am And pm x 1week, then only during the day continuously. Continue use of aleve 220mg  BID with food x 1week. Adjust desk and use wrist support to keep wrist at neutral postion when working on the computer. Provided worknote for reduced hours x 1week  Problem List Items Addressed This Visit   None Visit Diagnoses     Carpal tunnel syndrome on both sides    -  Primary   Flu vaccine need       Relevant Orders   Flu Vaccine QUAD 6+ mos PF IM (Fluarix Quad PF) (Completed)       Follow-up: Return if symptoms worsen or fail to improve.  Wilfred Lacy,  NP

## 2021-05-17 NOTE — Patient Instructions (Signed)
Use wrist brace am And pm x 1week, then only during the day continuously. Continue use of aleve 220mg  BID with food x 1week. Adjust desk and use wrist support to keep wrist at neutral postion when working on the computer.  Carpal Tunnel Syndrome Carpal tunnel syndrome is a condition that causes pain, weakness, and numbness in your hand and arm. Numbness is when you cannot feel an area in your body. The carpal tunnel is a narrow area that is on the palm side of your wrist. Repeated wrist motion or certain diseases may cause swelling in the tunnel. This swelling can pinch the main nerve in the wrist. This nerve is called the median nerve. What are the causes? This condition may be caused by: Moving your hand and wrist over and over again while doing a task. Injury to the wrist. Arthritis. A sac of fluid (cyst) or abnormal growth (tumor) in the carpal tunnel. Fluid buildup during pregnancy. Use of tools that vibrate. Sometimes the cause is not known. What increases the risk? The following factors may make you more likely to have this condition: Having a job that makes you do these things: Move your hand over and over again. Work with tools that vibrate, such as drills or sanders. Being a woman. Having diabetes, obesity, thyroid problems, or kidney failure. What are the signs or symptoms? Symptoms of this condition include: A tingling feeling in your fingers. Tingling or loss of feeling in your hand. Pain in your entire arm. This pain may get worse when you bend your wrist and elbow for a long time. Pain in your wrist that goes up your arm to your shoulder. Pain that goes down into your palm or fingers. Weakness in your hands. You may find it hard to grab and hold items. You may feel worse at night. How is this treated? This condition may be treated with: Lifestyle changes. You will be asked to stop or change the activity that caused your problem. Doing exercises and activities that  make bones, muscles, and tendons stronger (physical therapy). Learning how to use your hand again (occupational therapy). Medicines for pain and swelling. You may have injections in your wrist. A wrist splint or brace. Surgery. Follow these instructions at home: If you have a splint or brace: Wear the splint or brace as told by your doctor. Take it off only as told by your doctor. Loosen the splint if your fingers: Tingle. Become numb. Turn cold and blue. Keep the splint or brace clean. If the splint or brace is not waterproof: Do not let it get wet. Cover it with a watertight covering when you take a bath or a shower. Managing pain, stiffness, and swelling If told, put ice on the painful area: If you have a removable splint or brace, remove it as told by your doctor. Put ice in a plastic bag. Place a towel between your skin and the bag. Leave the ice on for 20 minutes, 2-3 times per day. Do not fall asleep with the cold pack on your skin. Take off the ice if your skin turns bright red. This is very important. If you cannot feel pain, heat, or cold, you have a greater risk of damage to the area. Move your fingers often to reduce stiffness and swelling. General instructions Take over-the-counter and prescription medicines only as told by your doctor. Rest your wrist from any activity that may cause pain. If needed, talk with your boss at work about changes that can  help your wrist heal. Do exercises as told by your doctor, physical therapist, or occupational therapist. Keep all follow-up visits. Contact a doctor if: You have new symptoms. Medicine does not help your pain. Your symptoms get worse. Get help right away if: You have very bad numbness or tingling in your wrist or hand. Summary Carpal tunnel syndrome is a condition that causes pain in your hand and arm. It is often caused by repeated wrist motions. Lifestyle changes and medicines are used to treat this problem. Surgery  may help in very bad cases. Follow your doctor's instructions about wearing a splint, resting your wrist, keeping follow-up visits, and calling for help. This information is not intended to replace advice given to you by your health care provider. Make sure you discuss any questions you have with your health care provider. Document Revised: 10/16/2019 Document Reviewed: 10/16/2019 Elsevier Patient Education  [place].

## 2021-05-31 ENCOUNTER — Emergency Department (HOSPITAL_BASED_OUTPATIENT_CLINIC_OR_DEPARTMENT_OTHER): Payer: 59

## 2021-05-31 ENCOUNTER — Emergency Department (HOSPITAL_BASED_OUTPATIENT_CLINIC_OR_DEPARTMENT_OTHER)
Admission: EM | Admit: 2021-05-31 | Discharge: 2021-05-31 | Disposition: A | Discharge status: Home / Self Care | Payer: 59 | Attending: Emergency Medicine | Admitting: Emergency Medicine

## 2021-05-31 ENCOUNTER — Encounter (HOSPITAL_BASED_OUTPATIENT_CLINIC_OR_DEPARTMENT_OTHER): Payer: Self-pay | Admitting: *Deleted

## 2021-05-31 ENCOUNTER — Other Ambulatory Visit: Payer: Self-pay

## 2021-05-31 DIAGNOSIS — F17210 Nicotine dependence, cigarettes, uncomplicated: Secondary | ICD-10-CM | POA: Insufficient documentation

## 2021-05-31 DIAGNOSIS — Z79899 Other long term (current) drug therapy: Secondary | ICD-10-CM | POA: Insufficient documentation

## 2021-05-31 DIAGNOSIS — R079 Chest pain, unspecified: Secondary | ICD-10-CM

## 2021-05-31 DIAGNOSIS — I10 Essential (primary) hypertension: Secondary | ICD-10-CM | POA: Insufficient documentation

## 2021-05-31 DIAGNOSIS — R0789 Other chest pain: Secondary | ICD-10-CM | POA: Diagnosis present

## 2021-05-31 LAB — CBC
HCT: 40 % (ref 36.0–46.0)
Hemoglobin: 13.6 g/dL (ref 12.0–15.0)
MCH: 31.7 pg (ref 26.0–34.0)
MCHC: 34 g/dL (ref 30.0–36.0)
MCV: 93.2 fL (ref 80.0–100.0)
Platelets: 332 10*3/uL (ref 150–400)
RBC: 4.29 MIL/uL (ref 3.87–5.11)
RDW: 13.5 % (ref 11.5–15.5)
WBC: 7.4 10*3/uL (ref 4.0–10.5)
nRBC: 0 % (ref 0.0–0.2)

## 2021-05-31 LAB — BASIC METABOLIC PANEL
Anion gap: 9 (ref 5–15)
BUN: 14 mg/dL (ref 6–20)
CO2: 25 mmol/L (ref 22–32)
Calcium: 9.1 mg/dL (ref 8.9–10.3)
Chloride: 99 mmol/L (ref 98–111)
Creatinine, Ser: 0.85 mg/dL (ref 0.44–1.00)
GFR, Estimated: >60 mL/min (ref >60)
Glucose, Bld: 87 mg/dL (ref 70–99)
Potassium: 3.7 mmol/L (ref 3.5–5.1)
Sodium: 133 mmol/L — ABNORMAL LOW (ref 135–145)

## 2021-05-31 LAB — URINALYSIS, ROUTINE W REFLEX MICROSCOPIC
Bilirubin Urine: NEGATIVE
Glucose, UA: NEGATIVE mg/dL
Hgb urine dipstick: NEGATIVE
Ketones, ur: NEGATIVE mg/dL
Leukocytes,Ua: NEGATIVE
Nitrite: NEGATIVE
Protein, ur: NEGATIVE mg/dL
Specific Gravity, Urine: 1.025 (ref 1.005–1.030)
pH: 7 (ref 5.0–8.0)

## 2021-05-31 LAB — TROPONIN I (HIGH SENSITIVITY)
Troponin I (High Sensitivity): 2 ng/L (ref <18)
Troponin I (High Sensitivity): <2 ng/L (ref <18)

## 2021-05-31 MED ORDER — IBUPROFEN 400 MG PO TABS: 400.0000 mg | ORAL_TABLET | Freq: Three times a day (TID) | ORAL | 0 refills | Status: AC | PRN

## 2021-05-31 NOTE — ED Provider Notes (Signed)
[facility] EMERGENCY DEPARTMENT Provider Note   CSN: [id number] Arrival date & time: 05/31/21  1948     History Chief Complaint  Patient presents with   Chest Pain    Emily Baker is a 50 y.o. female.   Chest Pain  HPI: A 50 year old patient with a history of hypertension presents for evaluation of chest pain. Initial onset of pain was less than one hour ago. The patient's chest pain is described as heaviness/pressure/tightness and is not worse with exertion. The patient reports some diaphoresis. The patient's chest pain is not middle- or left-sided, is not well-localized, is not sharp and does not radiate to the arms/jaw/neck. The patient does not complain of nausea. The patient has no history of stroke, has no history of peripheral artery disease, has not smoked in the past 90 days, denies any history of treated diabetes, has no relevant family history of coronary artery disease (first degree relative at less than age 62), has no history of hypercholesterolemia and does not have an elevated BMI (>=30).  Patient states the symptoms started shortly before arrival.  Pain is mostly on the right side of her chest.  She has never had symptoms like this before.  No history of PE or DVT or cardiac disease.  No complaints of leg swelling. Past Medical History:  Diagnosis Date   Family history of ovarian cancer    Patient's mother   Hx of migraine headaches    Hypertension     Patient Active Problem List   Diagnosis Date Noted   Vitamin D insufficiency 11/09/2020   Vitamin B12 deficiency 11/09/2020   Paresthesia 10/15/2019   Genital herpes 08/14/2018   Abnormal mammogram 07/30/2017   Microcalcification of right breast on mammogram 07/23/2017   Low back pain 06/07/2015   Tobacco abuse disorder 11/04/2014   Essential (primary) hypertension 11/18/2013   Migraine headache 11/18/2013    Past Surgical History:  Procedure Laterality Date   BREAST BIOPSY Right    BREAST  EXCISIONAL BIOPSY Right 2019   ENDOMETRIAL ABLATION  11/2007   FOOT SURGERY Bilateral    LAPAROSCOPY     X 3- Hx endometriosis per patient   TUBAL LIGATION       OB History     Gravida  4   Para  4   Term      Preterm      AB      Living         SAB      IAB      Ectopic      Multiple      Live Births              Family History  Problem Relation Age of Onset   Colon cancer Mother    Ovarian cancer Mother    Hypertension Mother    Cancer Mother        Ovarian cancer   Hyperlipidemia Brother    Hypertension Brother    Hyperlipidemia Brother    Hypertension Maternal Grandfather    Colon cancer Maternal Grandfather 60   Diabetes Mellitus II Paternal Grandmother    Healthy Child        x 4   Esophageal cancer Neg Hx    Stomach cancer Neg Hx    Rectal cancer Neg Hx     Social History   Tobacco Use   Smoking status: Every Day    Packs/day: 0.50    Types: Cigarettes  Smokeless tobacco: Never  Vaping Use   Vaping Use: Never used  Substance Use Topics   Alcohol use: Yes    Alcohol/week: 0.0 standard drinks    Comment: occasional   Drug use: No    Home Medications Prior to Admission medications   Medication Sig Start Date End Date Taking? Authorizing Provider  ibuprofen (ADVIL) 400 MG tablet Take 1 tablet (400 mg total) by mouth every 8 (eight) hours as needed for up to 7 days for fever, headache, mild pain or moderate pain. 05/31/21 06/07/21 Yes Dorie Rank, MD  olmesartan (BENICAR) 20 MG tablet Take 1 tablet (20 mg total) by mouth daily. 11/09/20  Yes Nche, Charlene Brooke, NP  Elagolix Sodium (ORILISSA) 150 MG TABS Take 1 capsule by mouth daily. Patient not taking: Reported on 04/11/2021 04/30/19   Lavonia Drafts, MD  varenicline (CHANTIX) 0.5 MG tablet take 1 tab by mouth  daily x 3 days, then 1 tab twice a day x 7days, then 2 tabs twice a day continuously. Patient not taking: Reported on 02/03/2021 11/09/20   Nche, Charlene Brooke, NP     Allergies    Patient has no known allergies.  Review of Systems   Review of Systems  Cardiovascular:  Positive for chest pain.  All other systems reviewed and are negative.  Physical Exam Updated Vital Signs BP 105/80    Pulse 77    Temp 98 F (36.7 C) (Oral)    Resp 20    Ht 1.607 m (5' 3.25")    Wt 67.4 kg    SpO2 100%    BMI 26.11 kg/m   Physical Exam Vitals and nursing note reviewed.  Constitutional:      General: She is not in acute distress.    Appearance: She is well-developed.  HENT:     Head: Normocephalic and atraumatic.     Right Ear: External ear normal.     Left Ear: External ear normal.  Eyes:     General: No scleral icterus.       Right eye: No discharge.        Left eye: No discharge.     Conjunctiva/sclera: Conjunctivae normal.  Neck:     Trachea: No tracheal deviation.  Cardiovascular:     Rate and Rhythm: Normal rate and regular rhythm.  Pulmonary:     Effort: Pulmonary effort is normal. No respiratory distress.     Breath sounds: Normal breath sounds. No stridor. No wheezing or rales.  Abdominal:     General: Bowel sounds are normal. There is no distension.     Palpations: Abdomen is soft.     Tenderness: There is no abdominal tenderness. There is no guarding or rebound.  Musculoskeletal:        General: No tenderness or deformity.     Cervical back: Neck supple.  Skin:    General: Skin is warm and dry.     Findings: No rash.  Neurological:     General: No focal deficit present.     Mental Status: She is alert.     Cranial Nerves: No cranial nerve deficit (no facial droop, extraocular movements intact, no slurred speech).     Sensory: No sensory deficit.     Motor: No abnormal muscle tone or seizure activity.     Coordination: Coordination normal.  Psychiatric:        Mood and Affect: Mood normal.    ED Results / Procedures / Treatments   Labs (all labs ordered  are listed, but only abnormal results are displayed) Labs Reviewed   BASIC METABOLIC PANEL - Abnormal; Notable for the following components:      Result Value   Sodium 133 (*)    All other components within normal limits  URINALYSIS, ROUTINE W REFLEX MICROSCOPIC - Abnormal; Notable for the following components:   APPearance HAZY (*)    All other components within normal limits  CBC  TROPONIN I (HIGH SENSITIVITY)  TROPONIN I (HIGH SENSITIVITY)    EKG EKG Interpretation  Date/Time:  Tuesday May 31 2021 19:56:16 EST Ventricular Rate:  66 PR Interval:  136 QRS Duration: 88 QT Interval:  386 QTC Calculation: 404 R Axis:   73 Text Interpretation: Normal sinus rhythm with sinus arrhythmia Normal ECG No significant change since last tracing Confirmed by Dorie Rank [id number]) on 05/31/2021 7:59:25 PM  Radiology DG Chest 2 View  Result Date: 05/31/2021 CLINICAL DATA:  Chest pain EXAM: CHEST - 2 VIEW COMPARISON:  09/16/2008 FINDINGS: The heart size and mediastinal contours are within normal limits. Both lungs are clear. The visualized skeletal structures are unremarkable. IMPRESSION: Negative. Electronically Signed   By: Rolm Baptise M.D.   On: 05/31/2021 20:11    Procedures Procedures   Medications Ordered in ED Medications - No data to display  ED Course  I have reviewed the triage vital signs and the nursing notes.  Pertinent labs & imaging results that were available during my care of the patient were reviewed by me and considered in my medical decision making (see chart for details).  Clinical Course as of 05/31/21 2322  Tue May 31, 2021  2138 Chest x-ray negative. [JK]  2309   Serial troponins normal.  CBC metabolic panel unremarkable [JK]    Clinical Course User Index [JK] Dorie Rank, MD   MDM Rules/Calculators/A&P HEAR Score: 4                         Patient presented with complaints of chest pain.  Moderate risk heart score.  ED work-up is overall reassuring.  Serial troponins are negative.  Doubt acute coronary  syndrome.  Pneumonia or pneumothorax.  Patient low risk for PE without significant risk factors.  PERC negative.  Patient is feeling better.  Recommend outpatient follow-up with PCP.  Warning signs precautions discussed Final Clinical Impression(s) / ED Diagnoses Final diagnoses:  Chest pain, unspecified type    Rx / DC Orders ED Discharge Orders          Ordered    ibuprofen (ADVIL) 400 MG tablet  Every 8 hours PRN        05/31/21 2319             Dorie Rank, MD 05/31/21 2323

## 2021-05-31 NOTE — ED Notes (Signed)
Patient transported to X-ray 

## 2021-05-31 NOTE — ED Triage Notes (Signed)
Chest pain x 45 minutes. Denies cough or cold.

## 2021-05-31 NOTE — ED Notes (Signed)
Patient discharged to home.  All discharge instructions reviewed.  Patient verbalized understanding via teachback method.  VS WDL.  Respirations even and unlabored.  Ambulatory out of ED.   °

## 2021-05-31 NOTE — Discharge Instructions (Addendum)
Take medications as needed for pain.  Follow-up with your doctor to be rechecked.  Return for worsening symptoms.

## 2021-06-06 ENCOUNTER — Ambulatory Visit: Payer: 59 | Admitting: Nurse Practitioner

## 2021-06-10 ENCOUNTER — Other Ambulatory Visit (HOSPITAL_BASED_OUTPATIENT_CLINIC_OR_DEPARTMENT_OTHER): Payer: Self-pay

## 2021-07-15 ENCOUNTER — Other Ambulatory Visit (HOSPITAL_BASED_OUTPATIENT_CLINIC_OR_DEPARTMENT_OTHER): Payer: Self-pay

## 2021-07-27 ENCOUNTER — Other Ambulatory Visit: Payer: Self-pay

## 2021-07-27 DIAGNOSIS — I10 Essential (primary) hypertension: Secondary | ICD-10-CM

## 2021-07-27 MED ORDER — OLMESARTAN MEDOXOMIL 20 MG PO TABS: 20.0000 mg | ORAL_TABLET | Freq: Every day | ORAL | 3 refills | Status: DC

## 2021-07-27 NOTE — Telephone Encounter (Signed)
Pharmacy request for medication refill. Confirmed with patient that she is now with [facility], chart supports Rx, and patient scheduled for follow up appointment.

## 2021-08-06 NOTE — Progress Notes (Signed)
Acute Office Visit  Subjective:    Patient ID: Emily Baker, female    DOB: [birth year], 51 y.o.   MRN: [id number]  Chief Complaint  Patient presents with   Follow-up    F/u mammogram screening.     HPI Patient is in today to discuss recent mammogram. Mammogram done on 08/01/21 shows calcifications in right breast which warrant further imaging with diagnostic mammogram. She was told to follow-up with her PCP to order the diagnostic mammogram. She denies feeling any masses in her breast on self exam.   Past Medical History:  Diagnosis Date   Family history of ovarian cancer    Patient's mother   Hx of migraine headaches    Hypertension     Past Surgical History:  Procedure Laterality Date   BREAST BIOPSY Right    BREAST EXCISIONAL BIOPSY Right 2019   ENDOMETRIAL ABLATION  11/2007   FOOT SURGERY Bilateral    LAPAROSCOPY     X 3- Hx endometriosis per patient   TUBAL LIGATION      Family History  Problem Relation Age of Onset   Colon cancer Mother    Ovarian cancer Mother    Hypertension Mother    Cancer Mother        Ovarian cancer   Hyperlipidemia Brother    Hypertension Brother    Hyperlipidemia Brother    Hypertension Maternal Grandfather    Colon cancer Maternal Grandfather 60   Diabetes Mellitus II Paternal Grandmother    Healthy Child        x 4   Esophageal cancer Neg Hx    Stomach cancer Neg Hx    Rectal cancer Neg Hx     Social History   Socioeconomic History   Marital status: Married    Spouse name: Tyrone   Number of children: 4   Years of education: Not on file   Highest education level: Not on file  Occupational History   Occupation: Press photographer  Tobacco Use   Smoking status: Every Day    Packs/day: 0.50    Types: Cigarettes   Smokeless tobacco: Never  Vaping Use   Vaping Use: Never used  Substance and Sexual Activity   Alcohol use: Yes    Alcohol/week: 0.0 standard drinks    Comment: occasional   Drug use: No   Sexual  activity: Yes    Partners: Male    Birth control/protection: Surgical    Comment: uterine ablation  Other Topics Concern   Not on file  Social History Narrative   Not on file   Social Determinants of Health   Financial Resource Strain: Not on file  Food Insecurity: Not on file  Transportation Needs: Not on file  Physical Activity: Not on file  Stress: Not on file  Social Connections: Not on file  Intimate Partner Violence: Not on file    Outpatient Medications Prior to Visit  Medication Sig Dispense Refill   olmesartan (BENICAR) 20 MG tablet Take 1 tablet (20 mg total) by mouth daily. 90 tablet 3   Elagolix Sodium (ORILISSA) 150 MG TABS Take 1 capsule by mouth daily. (Patient not taking: Reported on 04/11/2021) 30 tablet 6   varenicline (CHANTIX) 0.5 MG tablet take 1 tab by mouth  daily x 3 days, then 1 tab twice a day x 7days, then 2 tabs twice a day continuously. (Patient not taking: Reported on 02/03/2021) 180 tablet 2   No facility-administered medications prior to visit.    No  Known Allergies  Review of Systems See pertinent positives and negatives per HPI.    Objective:    Physical Exam Vitals and nursing note reviewed.  Constitutional:      General: She is not in acute distress.    Appearance: Normal appearance.  HENT:     Head: Normocephalic.  Eyes:     Conjunctiva/sclera: Conjunctivae normal.  Cardiovascular:     Rate and Rhythm: Normal rate.  Pulmonary:     Effort: Pulmonary effort is normal.  Musculoskeletal:     Cervical back: Normal range of motion.  Skin:    General: Skin is warm.  Neurological:     General: No focal deficit present.     Mental Status: She is alert and oriented to person, place, and time.  Psychiatric:        Mood and Affect: Mood normal.        Behavior: Behavior normal.        Thought Content: Thought content normal.        Judgment: Judgment normal.    BP (!) 160/100 (BP Location: Left Arm, Cuff Size: Normal)    Pulse 70     Temp (!) 96 F (35.6 C) (Temporal)    Wt 156 lb 3.2 oz (70.9 kg)    SpO2 100%    BMI 27.45 kg/m  Wt Readings from Last 3 Encounters:  08/08/21 156 lb 3.2 oz (70.9 kg)  05/31/21 148 lb 9.4 oz (67.4 kg)  05/17/21 148 lb 9.6 oz (67.4 kg)    Health Maintenance Due  Topic Date Due   COVID-19 Vaccine (1) Never done   Zoster Vaccines- Shingrix (1 of 2) Never done    There are no preventive care reminders to display for this patient.   Lab Results  Component Value Date   TSH 0.942 04/11/2021   Lab Results  Component Value Date   WBC 7.4 05/31/2021   HGB 13.6 05/31/2021   HCT 40.0 05/31/2021   MCV 93.2 05/31/2021   PLT 332 05/31/2021   Lab Results  Component Value Date   NA 133 (L) 05/31/2021   K 3.7 05/31/2021   CO2 25 05/31/2021   GLUCOSE 87 05/31/2021   BUN 14 05/31/2021   CREATININE 0.85 05/31/2021   BILITOT 0.6 11/09/2020   ALKPHOS 52 11/09/2020   AST 15 11/09/2020   ALT 9 11/09/2020   PROT 7.2 11/09/2020   ALBUMIN 4.6 11/09/2020   CALCIUM 9.1 05/31/2021   ANIONGAP 9 05/31/2021   GFR 108.16 11/09/2020   Lab Results  Component Value Date   CHOL 198 11/09/2020   Lab Results  Component Value Date   HDL 68.70 11/09/2020   Lab Results  Component Value Date   LDLCALC 116 (H) 11/09/2020   Lab Results  Component Value Date   TRIG 65.0 11/09/2020   Lab Results  Component Value Date   CHOLHDL 3 11/09/2020   No results found for: HGBA1C     Assessment & Plan:   Problem List Items Addressed This Visit       Cardiovascular and Mediastinum   Essential (primary) hypertension    Blood pressure elevated on exam today. Repeat was 160/100. She has been more anxious recently with the abnormal mammogram finding. Encouraged her to check her blood pressure at home. Call if BP >140/90 for several days in a row or with chest pain, shortness of breath, or headache. Keep next scheduled appointment with PCP.        Other  Abnormal mammogram - Primary    She had  a mammogram on 08/01/21 that came back with abnormal calcifications in the right breast. She does have a history of this in 2019 and had biopsy done which was negative. Order place for diagnostic mammogram for follow-up.       Relevant Orders   MM Digital Diagnostic Unilat R     No orders of the defined types were placed in this encounter.    Charyl Dancer, NP

## 2021-08-08 ENCOUNTER — Ambulatory Visit (INDEPENDENT_AMBULATORY_CARE_PROVIDER_SITE_OTHER): Payer: Managed Care, Other (non HMO) | Admitting: Nurse Practitioner

## 2021-08-08 ENCOUNTER — Encounter: Payer: Self-pay | Admitting: Nurse Practitioner

## 2021-08-08 ENCOUNTER — Other Ambulatory Visit: Payer: Self-pay

## 2021-08-08 VITALS — BP 160/100 | HR 70 | Temp 96.0°F | Wt 156.2 lb

## 2021-08-08 DIAGNOSIS — R928 Other abnormal and inconclusive findings on diagnostic imaging of breast: Secondary | ICD-10-CM | POA: Diagnosis not present

## 2021-08-08 DIAGNOSIS — I10 Essential (primary) hypertension: Secondary | ICD-10-CM | POA: Diagnosis not present

## 2021-08-08 NOTE — Assessment & Plan Note (Signed)
Blood pressure elevated on exam today. Repeat was 160/100. She has been more anxious recently with the abnormal mammogram finding. Encouraged her to check her blood pressure at home. Call if BP >140/90 for several days in a row or with chest pain, shortness of breath, or headache. Keep next scheduled appointment with PCP.

## 2021-08-08 NOTE — Assessment & Plan Note (Signed)
>>  ASSESSMENT AND PLAN FOR ABNORMAL MAMMOGRAM WRITTEN ON 08/08/2021  4:07 PM BY MCELWEE, LAUREN A, NP  She had a mammogram on 08/01/21 that came back with abnormal calcifications in the right breast. She does have a history of this in 2019 and had biopsy done which was negative. Order place for diagnostic mammogram for follow-up.

## 2021-08-08 NOTE — Patient Instructions (Signed)
It was great to see you!  I have placed the order for your diagnostic mammogram. If they don't call you in the next few days, you can call them to schedule. Check your blood pressure at home. It is most likely elevated from this situational anxiety.   Take care,  Vance Peper, NP

## 2021-08-08 NOTE — Assessment & Plan Note (Signed)
She had a mammogram on 08/01/21 that came back with abnormal calcifications in the right breast. She does have a history of this in 2019 and had biopsy done which was negative. Order place for diagnostic mammogram for follow-up.

## 2021-08-10 LAB — HM MAMMOGRAPHY

## 2021-08-31 ENCOUNTER — Telehealth (INDEPENDENT_AMBULATORY_CARE_PROVIDER_SITE_OTHER): Payer: Managed Care, Other (non HMO) | Admitting: Nurse Practitioner

## 2021-08-31 ENCOUNTER — Encounter: Payer: Self-pay | Admitting: Nurse Practitioner

## 2021-08-31 VITALS — Ht 63.25 in | Wt 154.0 lb

## 2021-08-31 DIAGNOSIS — M151 Heberden's nodes (with arthropathy): Secondary | ICD-10-CM | POA: Diagnosis not present

## 2021-08-31 NOTE — Progress Notes (Signed)
Virtual Visit via Video Note ? ?I connected withNAME@ on 09/01/21 at  8:00 AM EDT by a video enabled telemedicine application and verified that I am speaking with the correct person using two identifiers. ? ?Location: ?Patient:Home ?Provider: Office ?Participants: patient and provider ? ?I discussed the limitations of evaluation and management by telemedicine and the availability of in person appointments. ?I also discussed with the patient that there may be a patient responsible charge related to this service. The patient expressed understanding and agreed to proceed. ? ?CC:Pt c/o knot on her right middle finger x 2 months. Pt states she was wearing a brace as instructed during her last OV. Pt states the knot has got bigger and is now more painful.  ? ?History of Present Illness: ? Hand Pain  ?The incident occurred more than 1 week ago. The injury mechanism was repetitive motion (typing on a computer). The pain is present in the right fingers. The quality of the pain is described as aching. The pain does not radiate. The pain is mild. The pain has been Constant since the incident. Pertinent negatives include no chest pain, muscle weakness, numbness or tingling. The symptoms are aggravated by movement and palpation. She has tried heat and ice for the symptoms. The treatment provided no relief.   ?Observations/Objective: ?Physical Exam ?Vitals reviewed.  ?Musculoskeletal:     ?   General: Tenderness present.  ?   Right hand: Tenderness present. No swelling. Decreased range of motion.  ?   Left hand: Normal.  ?     Hands: ? ?Skin: ?   Findings: No erythema or rash.  ?Neurological:  ?   Mental Status: She is alert.  ?  ?Assessment and Plan: ?Barry was seen today for acute visit. ? ?Diagnoses and all orders for this visit: ? ?Heberden's nodes of right hand ? ? ?Follow Up Instructions: ?Use voltaren gel ?Avoid using finger is possible ?Schedule in person appt if no improvement in 2weeks ?  ?I discussed the assessment  and treatment plan with the patient. The patient was provided an opportunity to ask questions and all were answered. The patient agreed with the plan and demonstrated an understanding of the instructions. ?  ?The patient was advised to call back or seek an in-person evaluation if the symptoms worsen or if the condition fails to improve as anticipated. ? ?Wilfred Lacy, NP  ?

## 2021-08-31 NOTE — Patient Instructions (Signed)
Use voltaren gel ?Avoid using finger is possible ?Schedule in person appt if no improvement in 2weeks ? ?Arthritis ?Arthritis means joint pain. It can also mean joint disease. A joint is a place where bones come together. There are more than 100 types of arthritis. ?What are the causes? ?This condition may be caused by: ?Wear and tear of a joint. This is the most common cause. ?A lot of acid in the blood, which leads to pain in the joint (gout). ?Pain and swelling (inflammation) in a joint. ?Infection of a joint. ?Injuries in the joint. ?A reaction to medicines (allergy). ?In some cases, the cause may not be known. ?What are the signs or symptoms? ?Symptoms of this condition include: ?Redness at a joint. ?Swelling at a joint. ?Stiffness at a joint. ?Warmth coming from the joint. ?A fever. ?A feeling of being sick. ?How is this treated? ?This condition may be treated with: ?Treating the cause, if it is known. ?Rest. ?Raising (elevating) the joint. ?Putting cold or hot packs on the joint. ?Medicines to treat symptoms and reduce pain and swelling. ?Shots of medicines (cortisone) into the joint. ?You may also be told to make changes in your life, such as doing exercises and losing weight. ?Follow these instructions at home: ?Medicines ?Take over-the-counter and prescription medicines only as told by your doctor. ?Do not take aspirin for pain if your doctor says that you may have gout. ?Activity ?Rest your joint if your doctor tells you to. ?Avoid activities that make the pain worse. ?Exercise your joint regularly as told by your doctor. Try doing exercises like: ?Swimming. ?Water aerobics. ?Biking. ?Walking. ?Managing pain, stiffness, and swelling ?  ?If told, put ice on the affected area. ?Put ice in a plastic bag. ?Place a towel between your skin and the bag. ?Leave the ice on for 20 minutes, 2-3 times per day. ?If your joint is swollen, raise (elevate) it above the level of your heart if told by your doctor. ?If your  joint feels stiff in the morning, try taking a warm shower. ?If told, put heat on the affected area. Do this as often as told by your doctor. Use the heat source that your doctor recommends, such as a moist heat pack or a heating pad. If you have diabetes, do not apply heat without asking your doctor. To apply heat: ?Place a towel between your skin and the heat source. ?Leave the heat on for 20-30 minutes. ?Remove the heat if your skin turns bright red. This is very important if you are unable to feel pain, heat, or cold. You may have a greater risk of getting burned. ?General instructions ?Do not use any products that contain nicotine or tobacco, such as cigarettes, e-cigarettes, and chewing tobacco. If you need help quitting, ask your doctor. ?Keep all follow-up visits as told by your doctor. This is important. ?Contact a doctor if: ?The pain gets worse. ?You have a fever. ?Get help right away if: ?You have very bad pain in your joint. ?You have swelling in your joint. ?Your joint is red. ?Many joints become painful and swollen. ?You have very bad back pain. ?Your leg is very weak. ?You cannot control your pee (urine) or poop (stool). ?Summary ?Arthritis means joint pain. It can also mean joint disease. A joint is a place where bones come together. ?The most common cause of this condition is wear and tear of a joint. ?Symptoms of this condition include redness, swelling, or stiffness of the joint. ?This condition is  treated with rest, raising the joint, medicines, and putting cold or hot packs on the joint. ?Follow your doctor's instructions about medicines, activity, exercises, and other home care treatments. ?This information is not intended to replace advice given to you by your health care provider. Make sure you discuss any questions you have with your health care provider. ?Document Revised: 05/13/2018 Document Reviewed: 05/13/2018 ?Elsevier Patient Education ? [place]. ? ?

## 2021-09-01 ENCOUNTER — Encounter: Payer: Self-pay | Admitting: Nurse Practitioner

## 2021-09-01 DIAGNOSIS — M151 Heberden's nodes (with arthropathy): Secondary | ICD-10-CM | POA: Insufficient documentation

## 2021-09-01 HISTORY — DX: Heberden's nodes (with arthropathy): M15.1

## 2021-09-19 ENCOUNTER — Ambulatory Visit: Payer: Self-pay | Admitting: Nurse Practitioner

## 2022-01-12 ENCOUNTER — Ambulatory Visit (INDEPENDENT_AMBULATORY_CARE_PROVIDER_SITE_OTHER): Payer: Managed Care, Other (non HMO)

## 2022-01-12 ENCOUNTER — Other Ambulatory Visit (HOSPITAL_BASED_OUTPATIENT_CLINIC_OR_DEPARTMENT_OTHER): Payer: Self-pay

## 2022-01-12 ENCOUNTER — Other Ambulatory Visit (INDEPENDENT_AMBULATORY_CARE_PROVIDER_SITE_OTHER): Payer: Managed Care, Other (non HMO)

## 2022-01-12 ENCOUNTER — Ambulatory Visit (INDEPENDENT_AMBULATORY_CARE_PROVIDER_SITE_OTHER): Payer: Managed Care, Other (non HMO) | Admitting: Family Medicine

## 2022-01-12 VITALS — BP 128/78 | HR 70 | Temp 97.3°F | Wt 151.8 lb

## 2022-01-12 DIAGNOSIS — M009 Pyogenic arthritis, unspecified: Secondary | ICD-10-CM

## 2022-01-12 HISTORY — DX: Pyogenic arthritis, unspecified: M00.9

## 2022-01-12 LAB — COMPREHENSIVE METABOLIC PANEL
ALT: 16 U/L (ref 0–35)
AST: 20 U/L (ref 0–37)
Albumin: 4.6 g/dL (ref 3.5–5.2)
Alkaline Phosphatase: 60 U/L (ref 39–117)
BUN: 13 mg/dL (ref 6–23)
CO2: 26 mEq/L (ref 19–32)
Calcium: 9.4 mg/dL (ref 8.4–10.5)
Chloride: 105 mEq/L (ref 96–112)
Creatinine, Ser: 0.74 mg/dL (ref 0.40–1.20)
GFR: 94.27 mL/min (ref >60.00)
Glucose, Bld: 95 mg/dL (ref 70–99)
Potassium: 3.7 mEq/L (ref 3.5–5.1)
Sodium: 140 mEq/L (ref 135–145)
Total Bilirubin: 0.5 mg/dL (ref 0.2–1.2)
Total Protein: 7.5 g/dL (ref 6.0–8.3)

## 2022-01-12 LAB — CBC WITH DIFFERENTIAL/PLATELET
Basophils Absolute: 0 10*3/uL (ref 0.0–0.1)
Basophils Relative: 0.6 % (ref 0.0–3.0)
Eosinophils Absolute: 0.1 10*3/uL (ref 0.0–0.7)
Eosinophils Relative: 0.8 % (ref 0.0–5.0)
HCT: 39.3 % (ref 36.0–46.0)
Hemoglobin: 13.3 g/dL (ref 12.0–15.0)
Lymphocytes Relative: 45.9 % (ref 12.0–46.0)
Lymphs Abs: 3.3 10*3/uL (ref 0.7–4.0)
MCHC: 33.8 g/dL (ref 30.0–36.0)
MCV: 96.5 fl (ref 78.0–100.0)
Monocytes Absolute: 0.5 10*3/uL (ref 0.1–1.0)
Monocytes Relative: 7.7 % (ref 3.0–12.0)
Neutro Abs: 3.2 10*3/uL (ref 1.4–7.7)
Neutrophils Relative %: 45 % (ref 43.0–77.0)
Platelets: 260 10*3/uL (ref 150.0–400.0)
RBC: 4.07 Mil/uL (ref 3.87–5.11)
RDW: 13.6 % (ref 11.5–15.5)
WBC: 7.1 10*3/uL (ref 4.0–10.5)

## 2022-01-12 LAB — C-REACTIVE PROTEIN: CRP: <1 mg/dL (ref 0.5–20.0)

## 2022-01-12 LAB — SEDIMENTATION RATE: Sed Rate: 10 mm/hr (ref 0–30)

## 2022-01-12 MED ORDER — LIDOCAINE HCL 1 % IJ SOLN
1000.0000 mg | Freq: Once | INTRAMUSCULAR | Status: AC
  Administered 2022-01-12: 1000 mg via INTRAMUSCULAR

## 2022-01-12 MED ORDER — NAPROXEN 500 MG PO TABS
500.0000 mg | ORAL_TABLET | Freq: Two times a day (BID) | ORAL | 0 refills | Status: AC
  Filled 2022-01-12: qty 60, 30d supply, fill #0

## 2022-01-12 MED ORDER — SULFAMETHOXAZOLE-TRIMETHOPRIM 800-160 MG PO TABS
2.0000 | ORAL_TABLET | Freq: Two times a day (BID) | ORAL | 0 refills | Status: AC
  Filled 2022-01-12: qty 56, 14d supply, fill #0

## 2022-01-12 NOTE — Patient Instructions (Signed)
We are getting labs and x-ray to assess for infection. We are starting Bactrim to cover for infection. We are urgently referring to hand surgery to evaluate joint. We are sending in naproxen for pain. If you develop severe symptoms of fevers, extreme pain not improved with the naproxen, drainage or any other worrisome symptom please go to the emergency department

## 2022-01-12 NOTE — Progress Notes (Unsigned)
Initial visit for RT middle finger pain following surgery. Concern for surgical site infection overlying RT lateral DIP if third finger.

## 2022-01-12 NOTE — Assessment & Plan Note (Signed)
Concern for surgical site infection overlying right lateral DIP of third finger Stat labs ordered including CBC, CMP, ESR, CRP, blood culture X-ray ordered Urgent referral to hand surgery Strict return and ED precautions discussed with patient 1 g ceftriaxone x1 given IM Bactrim 800 mg / 160 mg, 2 tablets twice daily 14 days

## 2022-01-12 NOTE — Progress Notes (Signed)
Assessment/Plan:   Problem List Items Addressed This Visit       Musculoskeletal and Integument   Pyogenic arthritis of right hand ([place]) - Primary    Concern for surgical site infection overlying right lateral DIP of third finger Stat labs ordered including CBC, CMP, ESR, CRP, blood culture X-ray ordered Urgent referral to hand surgery Strict return and ED precautions discussed with patient 1 g ceftriaxone x1 given IM Bactrim 800 mg / 160 mg, 2 tablets twice daily 14 days      Relevant Medications   cefTRIAXone (ROCEPHIN) 1,000 mg in lidocaine (XYLOCAINE) 1 % IM only syringe   sulfamethoxazole-trimethoprim (BACTRIM DS) 800-160 MG tablet   naproxen (NAPROSYN) 500 MG tablet   Other Relevant Orders   Ambulatory referral to Hand Surgery   Blood culture (routine single)   CBC w/Diff   Comp Met (CMET)   C-reactive protein   DG Finger Middle Right   Sedimentation rate       Subjective:  HPI:  Emily Baker is a 51 y.o. female who has Essential (primary) hypertension; Migraine headache; Tobacco abuse disorder; Low back pain; Microcalcification of right breast on mammogram; Abnormal mammogram; Genital herpes; Paresthesia; Vitamin D insufficiency; Vitamin B12 deficiency; Heberden's nodes of right hand; and Pyogenic arthritis of right hand ([place]) on their problem list..   She  has a past medical history of Family history of ovarian cancer, migraine headaches, and Hypertension..   She presents with chief complaint of Finger swelling (Right middle finger swelling and pain since going back to work after surgery. ) .   Patient presenting 1 week out from surgery on right middle finger.  Patient had ganglion cyst incised.  This was done at a facility outside of the [facility] system.  Since that time, she has had increasing swelling and pain.  She can no longer bend or extend the finger due to pain.  Patient did have oxycodone for pain, but was not able to tolerate it due to sedation.  Patient denies any drainage or fevers.  Past Surgical History:  Procedure Laterality Date   BREAST BIOPSY Right    BREAST EXCISIONAL BIOPSY Right 2019   ENDOMETRIAL ABLATION  11/2007   FOOT SURGERY Bilateral    LAPAROSCOPY     X 3- Hx endometriosis per patient   TUBAL LIGATION      Outpatient Medications Prior to Visit  Medication Sig Dispense Refill   olmesartan (BENICAR) 20 MG tablet Take 1 tablet (20 mg total) by mouth daily. 90 tablet 3   No facility-administered medications prior to visit.    Family History  Problem Relation Age of Onset   Colon cancer Mother    Ovarian cancer Mother    Hypertension Mother    Cancer Mother        Ovarian cancer   Hyperlipidemia Brother    Hypertension Brother    Hyperlipidemia Brother    Hypertension Maternal Grandfather    Colon cancer Maternal Grandfather 43   Diabetes Mellitus II Paternal Grandmother    Healthy Child        x 4   Esophageal cancer Neg Hx    Stomach cancer Neg Hx    Rectal cancer Neg Hx     Social History   Socioeconomic History   Marital status: Married    Spouse name: Tyrone   Number of children: 4   Years of education: Not on file   Highest education level: Not on file  Occupational History  Occupation: Press photographer  Tobacco Use   Smoking status: Every Day    Packs/day: 0.50    Types: Cigarettes   Smokeless tobacco: Never  Vaping Use   Vaping Use: Never used  Substance and Sexual Activity   Alcohol use: Yes    Alcohol/week: 0.0 standard drinks of alcohol    Comment: occasional   Drug use: No   Sexual activity: Yes    Partners: Male    Birth control/protection: Surgical    Comment: uterine ablation  Other Topics Concern   Not on file  Social History Narrative   Not on file   Social Determinants of Health   Financial Resource Strain: Not on file  Food Insecurity: Not on file  Transportation Needs: Not on file  Physical Activity: Not on file  Stress: Not on file  Social  Connections: Not on file  Intimate Partner Violence: Not on file                                                                                                 Objective:  Physical Exam: BP 128/78 (BP Location: Left Arm, Patient Position: Sitting, Cuff Size: Large)   Pulse 70   Temp (!) 97.3 F (36.3 C) (Temporal)   Wt 151 lb 12.8 oz (68.9 kg)   SpO2 98%   BMI 26.68 kg/m    General: No acute distress. Awake and conversant.  Eyes: Normal conjunctiva, anicteric. Round symmetric pupils.  ENT: Hearing grossly intact. No nasal discharge.  Neck: Neck is supple. No masses or thyromegaly.  Respiratory: Respirations are non-labored. No auditory wheezing.  Skin: Warm. No rashes or ulcers.  Psych: Alert and oriented. Cooperative, Appropriate mood and affect, Normal judgment.  CV: No cyanosis or JVD MSK: Right middle finger DIP has significant swelling surrounding area of incision and suture, there is no drainage, tender, limited extension and flexion Neuro: Sensation and CN II-XII grossly normal.        Alesia Banda, MD, MS

## 2022-01-13 ENCOUNTER — Ambulatory Visit (INDEPENDENT_AMBULATORY_CARE_PROVIDER_SITE_OTHER): Payer: Managed Care, Other (non HMO) | Admitting: Orthopedic Surgery

## 2022-01-13 DIAGNOSIS — M67449 Ganglion, unspecified hand: Secondary | ICD-10-CM | POA: Diagnosis not present

## 2022-01-13 DIAGNOSIS — M67441 Ganglion, right hand: Secondary | ICD-10-CM | POA: Insufficient documentation

## 2022-01-13 NOTE — Progress Notes (Signed)
Office Visit Note   Patient: Emily Baker           Date of Birth: May 31, 1971           MRN: [id number] Visit Date: 01/13/2022              Requested by: Flossie Buffy, NP [place],  [place] [id number] PCP: Flossie Buffy, NP   Assessment & Plan: Visit Diagnoses:  1. Mucous cyst of digit of hand     Plan: Patient is 11 days status post right middle finger digital mucous cyst excision in [facility]  She was seen by her PCP was some concern for a postsurgical infection given her continued pain at the DIP joint.  There is no erythema or drainage today.  There is appropriate postoperative swelling.  The wound remains approximated and the prolene sutures and removed.  We discussed keeping the wound clean with warm soapy water and covered.  She has a scheduled follow-up with her surgeon next week.  Follow-Up Instructions: No follow-ups on file.   Orders:  No orders of the defined types were placed in this encounter.  No orders of the defined types were placed in this encounter.     Procedures: No procedures performed   Clinical Data: No additional findings.   Subjective: Chief Complaint  Patient presents with   Right Middle Finger - Wound Check    This is a 51 year old right-hand-dominant female who presents status post right middle finger digital mucous cyst excision on 01/02/2022.  She had increased pain and swelling at the distal aspect of this finger postoperatively.  She was seen by her PCP who was concerned for a possible postoperative infection.  She denies any drainage from the wound.  She denies any erythema.  She does have some very mild swelling of the distal part of the finger.  She was given an IV dose of antibiotics and an oral prescription for Bactrim.  She is seeing her surgeon next week.  Wound Check    Review of Systems   Objective: Vital Signs: There were no vitals taken for this visit.  Physical  Exam Constitutional:      Appearance: Normal appearance.  Cardiovascular:     Rate and Rhythm: Normal rate.     Pulses: Normal pulses.  Pulmonary:     Effort: Pulmonary effort is normal.  Skin:    General: Skin is warm and dry.     Capillary Refill: Capillary refill takes less than 2 seconds.  Neurological:     Mental Status: She is alert.     Right Hand Exam   Tenderness  Right hand tenderness location: TTP at distal aspect of middle finger around surgical incision.  Other  Erythema: absent Sensation: normal Pulse: present  Comments:  No drainage from wound.  Mild swelling around DIP joint.       Specialty Comments:  No specialty comments available.  Imaging: No results found.   PMFS History: Patient Active Problem List   Diagnosis Date Noted   Mucous cyst of digit of hand 01/13/2022   Pyogenic arthritis of right hand ([place]) 01/12/2022   Heberden's nodes of right hand 09/01/2021   Vitamin D insufficiency 11/09/2020   Vitamin B12 deficiency 11/09/2020   Paresthesia 10/15/2019   Genital herpes 08/14/2018   Abnormal mammogram 07/30/2017   Microcalcification of right breast on mammogram 07/23/2017   Low back pain 06/07/2015   Tobacco abuse disorder 11/04/2014  Essential (primary) hypertension 11/18/2013   Migraine headache 11/18/2013   Past Medical History:  Diagnosis Date   Family history of ovarian cancer    Patient's mother   Hx of migraine headaches    Hypertension     Family History  Problem Relation Age of Onset   Colon cancer Mother    Ovarian cancer Mother    Hypertension Mother    Cancer Mother        Ovarian cancer   Hyperlipidemia Brother    Hypertension Brother    Hyperlipidemia Brother    Hypertension Maternal Grandfather    Colon cancer Maternal Grandfather 60   Diabetes Mellitus II Paternal Grandmother    Healthy Child        x 4   Esophageal cancer Neg Hx    Stomach cancer Neg Hx    Rectal cancer Neg Hx     Past Surgical  History:  Procedure Laterality Date   BREAST BIOPSY Right    BREAST EXCISIONAL BIOPSY Right 2019   ENDOMETRIAL ABLATION  11/2007   FOOT SURGERY Bilateral    LAPAROSCOPY     X 3- Hx endometriosis per patient   TUBAL LIGATION     Social History   Occupational History   Occupation: Press photographer  Tobacco Use   Smoking status: Every Day    Packs/day: 0.50    Types: Cigarettes   Smokeless tobacco: Never  Vaping Use   Vaping Use: Never used  Substance and Sexual Activity   Alcohol use: Yes    Alcohol/week: 0.0 standard drinks of alcohol    Comment: occasional   Drug use: No   Sexual activity: Yes    Partners: Male    Birth control/protection: Surgical    Comment: uterine ablation

## 2022-01-16 ENCOUNTER — Telehealth: Payer: Self-pay | Admitting: Nurse Practitioner

## 2022-01-16 NOTE — Telephone Encounter (Signed)
Called & provided pt w/ lab results

## 2022-01-16 NOTE — Telephone Encounter (Signed)
Caller Name: Emily Baker Call back phone #: [phone]  Reason for Call: she returned your call from earlier about her labs.

## 2022-01-18 LAB — CULTURE, BLOOD (SINGLE): MICRO NUMBER:: 13702885

## 2022-01-19 ENCOUNTER — Encounter: Payer: Self-pay | Admitting: General Practice

## 2022-06-05 ENCOUNTER — Ambulatory Visit (INDEPENDENT_AMBULATORY_CARE_PROVIDER_SITE_OTHER): Payer: Managed Care, Other (non HMO) | Admitting: Family Medicine

## 2022-06-05 ENCOUNTER — Encounter: Payer: Self-pay | Admitting: Nurse Practitioner

## 2022-06-05 VITALS — BP 124/80 | HR 94 | Temp 97.8°F | Wt 154.4 lb

## 2022-06-05 DIAGNOSIS — M67441 Ganglion, right hand: Secondary | ICD-10-CM

## 2022-06-05 DIAGNOSIS — Z23 Encounter for immunization: Secondary | ICD-10-CM | POA: Diagnosis not present

## 2022-06-05 MED ORDER — NAPROXEN 500 MG PO TABS
500.0000 mg | ORAL_TABLET | Freq: Two times a day (BID) | ORAL | 0 refills | Status: DC | PRN
Start: 2022-06-05 — End: 2022-06-06

## 2022-06-05 NOTE — Progress Notes (Signed)
Assessment/Plan:   Problem List Items Addressed This Visit       Other   Mucous cyst of digit of right hand - Primary    Mucous cyst of digit of right hand (Primary - ICD-10: M71.3): Considering the patient's symptoms and condition, she appears to be experiencing a recurrence of her former mucous cyst on the right third digit, which has been worsening despite her previous surgical intervention and management. Since this problem persists and previous surgical intervention was required, referral back to Dr. Tempie Donning with [facility] is advised. The patient could initiate a trial with Diclofenac. The patient has been issued work accommodations until she can be evaluated by Orthopedics for further assessment.      Relevant Orders   Ambulatory referral to Orthopedics   Other Visit Diagnoses     Needs flu shot       Relevant Orders   Flu Vaccine QUAD 6+ mos PF IM (Fluarix Quad PF) (Completed)          Subjective:  HPI:  Chief Complaint: The patient presented with persistent pain in her right middle finger, which increased over the last 6 weeks and is affecting her ability to type at work. The pain started after surgery in July to remove a cyst.  History of Present Illness: The patient, a 51 year old female, has a vast medical history including Essential (primary) hypertension; Migraine headache; Tobacco abuse disorder; Low back pain; Microcalcification of right breast on mammogram; Abnormal mammogram; Genital herpes; Paresthesia; Vitamin D insufficiency; Vitamin B12 deficiency; Heberden's nodes of right hand; Pyogenic arthritis of right hand. Despite undergoing surgery in July 2023, the patient continues to experience pain in her right middle finger. The surgeon discontinued care in October 2023 after no infection was found and the symptoms were not attributed to postoperative changes. She has tried over-the-counter ibuprofen with mild improvement.  Review of Systems: While  the patient has been facing difficulty in bodily movements like typing due to the pain, she denies having any fever, chills, nausea, vomiting or any other worrisome symptoms.  Medications: The patient is currently prescribed olmesartan (BENICAR) 20 MG tablet. She has to take 1 tablet (20 mg total) by mouth daily.  Past Medical History: The patient has a past medical history of Ovarian cancer, Migraine headaches, and Hypertension.  Past Surgical History: The patient had Breast Biopsy, Excisional Breast Biopsy, Endometrial Ablation, Laparoscopy, and Tubal Ligation in the past.  Family History: The patient has a family history of various illnesses such as Colon cancer and Ovarian cancer (Mother), Hyperlipidemia (Brother), Hypertension (Brother and Maternal Grandfather), Diabetes Mellitus II (Paternal Grandmother).  Social History: The patient is married and the mother of 4 children. She works in Press photographer. The patient smokes cigarettes every day and consumes alcohol occasionally.                                                                                               Objective:  Physical Exam: BP 124/80 (BP Location: Left Arm, Patient Position: Sitting, Cuff Size: Large)   Pulse 94   Temp 97.8 F (36.6 C) (Temporal)  Wt 154 lb 6.4 oz (70 kg)   SpO2 100%   BMI 27.13 kg/m    General: No acute distress. Awake and conversant.  Eyes: Normal conjunctiva, anicteric. Round symmetric pupils.  ENT: Hearing grossly intact. No nasal discharge.  Neck: Neck is supple. No masses or thyromegaly.  Respiratory: Respirations are non-labored. No auditory wheezing.  Skin: Warm. No rashes or ulcers.  Psych: Alert and oriented. Cooperative, Appropriate mood and affect, Normal judgment.  CV: No cyanosis or JVD MSK: At the right third finger there is a tender nodule at the DIP, there is no erythema or drainage, is tender to palpation.  Patient does have extension and flexion of the finger, although  it is slightly limited due to pain Neuro: Sensation and CN II-XII grossly normal.        Alesia Banda, MD, MS

## 2022-06-05 NOTE — Patient Instructions (Signed)
For finger pain, use naproxen. We are referring to Dr. Tempie Donning for reexamination.   Please follow up with [place], Charlene Brooke, NP for any further recurrence or to discuss work accommodations.

## 2022-06-06 ENCOUNTER — Telehealth: Payer: Self-pay | Admitting: Nurse Practitioner

## 2022-06-06 DIAGNOSIS — M67449 Ganglion, unspecified hand: Secondary | ICD-10-CM

## 2022-06-06 MED ORDER — DICLOFENAC SODIUM 75 MG PO TBEC: 75.0000 mg | DELAYED_RELEASE_TABLET | Freq: Two times a day (BID) | ORAL | 0 refills | Status: DC

## 2022-06-06 NOTE — Telephone Encounter (Signed)
Pt stated that she is waiting on you to send a referral to orthopedic . Pt stated the fax is [phone] the patient stated the pain med you gave her she can not take it spikes her BP

## 2022-06-06 NOTE — Telephone Encounter (Signed)
Patient is aware of annotation below and verbalized understanding.  

## 2022-06-07 NOTE — Telephone Encounter (Signed)
Please write note to allow patient limited typing at her job due to ongoing pain.  This is for the next 2 weeks.  If patient has no improvement, please have her follow-up for further evaluation.

## 2022-06-11 NOTE — Assessment & Plan Note (Signed)
Mucous cyst of digit of right hand (Primary - ICD-10: M71.3): Considering the patient's symptoms and condition, she appears to be experiencing a recurrence of her former mucous cyst on the right third digit, which has been worsening despite her previous surgical intervention and management. Since this problem persists and previous surgical intervention was required, referral back to Dr. Tempie Donning with [facility] is advised. The patient could initiate a trial with Diclofenac. The patient has been issued work accommodations until she can be evaluated by Orthopedics for further assessment.

## 2022-06-13 ENCOUNTER — Other Ambulatory Visit: Payer: Self-pay

## 2022-06-13 ENCOUNTER — Encounter (HOSPITAL_BASED_OUTPATIENT_CLINIC_OR_DEPARTMENT_OTHER): Payer: Self-pay | Admitting: Orthopedic Surgery

## 2022-06-17 ENCOUNTER — Other Ambulatory Visit: Payer: Self-pay | Admitting: Family Medicine

## 2022-06-21 ENCOUNTER — Ambulatory Visit (HOSPITAL_BASED_OUTPATIENT_CLINIC_OR_DEPARTMENT_OTHER): Payer: BC Managed Care – PPO | Admitting: Anesthesiology

## 2022-06-21 ENCOUNTER — Ambulatory Visit (HOSPITAL_BASED_OUTPATIENT_CLINIC_OR_DEPARTMENT_OTHER)
Admission: RE | Admit: 2022-06-21 | Discharge: 2022-06-21 | Disposition: A | Discharge status: Home / Self Care | Payer: BC Managed Care – PPO | Source: Ambulatory Visit | Attending: Orthopedic Surgery | Admitting: Orthopedic Surgery

## 2022-06-21 ENCOUNTER — Other Ambulatory Visit: Payer: Self-pay

## 2022-06-21 ENCOUNTER — Encounter (HOSPITAL_BASED_OUTPATIENT_CLINIC_OR_DEPARTMENT_OTHER)
Admission: RE | Disposition: A | Discharge status: Home / Self Care | Payer: Self-pay | Source: Ambulatory Visit | Attending: Orthopedic Surgery

## 2022-06-21 ENCOUNTER — Encounter (HOSPITAL_BASED_OUTPATIENT_CLINIC_OR_DEPARTMENT_OTHER): Payer: Self-pay | Admitting: Orthopedic Surgery

## 2022-06-21 DIAGNOSIS — M25841 Other specified joint disorders, right hand: Secondary | ICD-10-CM | POA: Diagnosis present

## 2022-06-21 DIAGNOSIS — I10 Essential (primary) hypertension: Secondary | ICD-10-CM | POA: Diagnosis not present

## 2022-06-21 DIAGNOSIS — F17210 Nicotine dependence, cigarettes, uncomplicated: Secondary | ICD-10-CM | POA: Diagnosis not present

## 2022-06-21 DIAGNOSIS — Z01818 Encounter for other preprocedural examination: Secondary | ICD-10-CM

## 2022-06-21 HISTORY — DX: Unspecified osteoarthritis, unspecified site: M19.90

## 2022-06-21 HISTORY — PX: CYST EXCISION: SHX5701

## 2022-06-21 LAB — POCT PREGNANCY, URINE: Preg Test, Ur: NEGATIVE

## 2022-06-21 SURGERY — CYST REMOVAL
Anesthesia: Monitor Anesthesia Care | Site: Middle Finger | Laterality: Right

## 2022-06-21 MED ORDER — DEXAMETHASONE SODIUM PHOSPHATE 10 MG/ML IJ SOLN
INTRAMUSCULAR | Status: DC | PRN
  Administered 2022-06-21: 5 mg via INTRAVENOUS

## 2022-06-21 MED ORDER — 0.9 % SODIUM CHLORIDE (POUR BTL) OPTIME
TOPICAL | Status: DC | PRN
  Administered 2022-06-21: 200 mL

## 2022-06-21 MED ORDER — BUPIVACAINE HCL (PF) 0.25 % IJ SOLN
INTRAMUSCULAR | Status: AC
  Filled 2022-06-21: qty 30

## 2022-06-21 MED ORDER — LIDOCAINE HCL (PF) 1 % IJ SOLN
INTRAMUSCULAR | Status: AC
  Filled 2022-06-21: qty 30

## 2022-06-21 MED ORDER — CEFAZOLIN SODIUM-DEXTROSE 2-4 GM/100ML-% IV SOLN
2.0000 g | INTRAVENOUS | Status: AC
  Administered 2022-06-21: 2 g via INTRAVENOUS

## 2022-06-21 MED ORDER — BACITRACIN ZINC 500 UNIT/GM EX OINT
TOPICAL_OINTMENT | CUTANEOUS | Status: DC | PRN
  Administered 2022-06-21: 1 via TOPICAL

## 2022-06-21 MED ORDER — MIDAZOLAM HCL 2 MG/2ML IJ SOLN
INTRAMUSCULAR | Status: AC
  Filled 2022-06-21: qty 2

## 2022-06-21 MED ORDER — ONDANSETRON HCL 4 MG/2ML IJ SOLN
INTRAMUSCULAR | Status: AC
  Filled 2022-06-21: qty 2

## 2022-06-21 MED ORDER — BUPIVACAINE HCL (PF) 0.25 % IJ SOLN
INTRAMUSCULAR | Status: DC | PRN
  Administered 2022-06-21: 19 mL

## 2022-06-21 MED ORDER — LIDOCAINE 2% (20 MG/ML) 5 ML SYRINGE
INTRAMUSCULAR | Status: DC | PRN
  Administered 2022-06-21: 20 mg via INTRAVENOUS

## 2022-06-21 MED ORDER — LIDOCAINE 2% (20 MG/ML) 5 ML SYRINGE
INTRAMUSCULAR | Status: AC
  Filled 2022-06-21: qty 5

## 2022-06-21 MED ORDER — PROPOFOL 500 MG/50ML IV EMUL
INTRAVENOUS | Status: DC | PRN
  Administered 2022-06-21: 75 ug/kg/min via INTRAVENOUS

## 2022-06-21 MED ORDER — ONDANSETRON HCL 4 MG/2ML IJ SOLN
INTRAMUSCULAR | Status: DC | PRN
  Administered 2022-06-21: 4 mg via INTRAVENOUS

## 2022-06-21 MED ORDER — DEXAMETHASONE SODIUM PHOSPHATE 10 MG/ML IJ SOLN
INTRAMUSCULAR | Status: AC
  Filled 2022-06-21: qty 1

## 2022-06-21 MED ORDER — FENTANYL CITRATE (PF) 100 MCG/2ML IJ SOLN: 25.0000 ug | INTRAMUSCULAR | Status: DC | PRN

## 2022-06-21 MED ORDER — FENTANYL CITRATE (PF) 100 MCG/2ML IJ SOLN
INTRAMUSCULAR | Status: DC | PRN
  Administered 2022-06-21: 50 ug via INTRAVENOUS

## 2022-06-21 MED ORDER — LACTATED RINGERS IV SOLN: INTRAVENOUS | Status: DC

## 2022-06-21 MED ORDER — FENTANYL CITRATE (PF) 100 MCG/2ML IJ SOLN
INTRAMUSCULAR | Status: AC
  Filled 2022-06-21: qty 2

## 2022-06-21 MED ORDER — PROPOFOL 10 MG/ML IV BOLUS
INTRAVENOUS | Status: AC
  Filled 2022-06-21: qty 20

## 2022-06-21 MED ORDER — MIDAZOLAM HCL 5 MG/5ML IJ SOLN
INTRAMUSCULAR | Status: DC | PRN
  Administered 2022-06-21: 2 mg via INTRAVENOUS

## 2022-06-21 MED ORDER — BACITRACIN ZINC 500 UNIT/GM EX OINT
TOPICAL_OINTMENT | CUTANEOUS | Status: AC
  Filled 2022-06-21: qty 28.35

## 2022-06-21 MED ORDER — PROPOFOL 10 MG/ML IV BOLUS
INTRAVENOUS | Status: DC | PRN
  Administered 2022-06-21: 20 mg via INTRAVENOUS
  Administered 2022-06-21: 30 mg via INTRAVENOUS
  Administered 2022-06-21: 20 mg via INTRAVENOUS

## 2022-06-21 MED ORDER — CEFAZOLIN SODIUM-DEXTROSE 2-4 GM/100ML-% IV SOLN
INTRAVENOUS | Status: AC
  Filled 2022-06-21: qty 100

## 2022-06-21 MED ORDER — ACETAMINOPHEN 500 MG PO TABS: 1000.0000 mg | ORAL_TABLET | Freq: Once | ORAL | Status: DC

## 2022-06-21 SURGICAL SUPPLY — 43 items
APL PRP STRL LF DISP 70% ISPRP (MISCELLANEOUS) ×1
BLADE SURG 15 STRL LF DISP TIS (BLADE) ×1 IMPLANT
BLADE SURG 15 STRL SS (BLADE) ×1
BNDG CMPR 9X4 STRL LF SNTH (GAUZE/BANDAGES/DRESSINGS)
BNDG COHESIVE 1X5 TAN STRL LF (GAUZE/BANDAGES/DRESSINGS) IMPLANT
BNDG ELASTIC 3X5.8 VLCR STR LF (GAUZE/BANDAGES/DRESSINGS) IMPLANT
BNDG ESMARK 4X9 LF (GAUZE/BANDAGES/DRESSINGS) IMPLANT
BNDG GAUZE DERMACEA FLUFF 4 (GAUZE/BANDAGES/DRESSINGS) IMPLANT
BNDG GZE DERMACEA 4 6PLY (GAUZE/BANDAGES/DRESSINGS)
CHLORAPREP W/TINT 26 (MISCELLANEOUS) ×1 IMPLANT
CORD BIPOLAR FORCEPS 12FT (ELECTRODE) ×1 IMPLANT
COVER BACK TABLE 60X90IN (DRAPES) ×1 IMPLANT
COVER MAYO STAND STRL (DRAPES) ×1 IMPLANT
CUFF TOURN SGL QUICK 18X4 (TOURNIQUET CUFF) ×1 IMPLANT
CUFF TOURN SGL QUICK 24 (TOURNIQUET CUFF)
CUFF TRNQT CYL 24X4X16.5-23 (TOURNIQUET CUFF) IMPLANT
DRAIN PENROSE 12X.25 LTX STRL (MISCELLANEOUS) IMPLANT
DRAPE EXTREMITY T 121X128X90 (DISPOSABLE) ×1 IMPLANT
DRAPE SURG 17X23 STRL (DRAPES) ×1 IMPLANT
GAUZE SPONGE 4X4 12PLY STRL (GAUZE/BANDAGES/DRESSINGS) IMPLANT
GAUZE XEROFORM 1X8 LF (GAUZE/BANDAGES/DRESSINGS) ×1 IMPLANT
GLOVE BIO SURGEON STRL SZ7 (GLOVE) ×1 IMPLANT
GLOVE BIOGEL PI IND STRL 7.0 (GLOVE) ×1 IMPLANT
GLOVE SURG SS PI 6.5 STRL IVOR (GLOVE) IMPLANT
GLOVE SURG SS PI 7.0 STRL IVOR (GLOVE) IMPLANT
GOWN STRL REUS W/ TWL LRG LVL3 (GOWN DISPOSABLE) ×1 IMPLANT
GOWN STRL REUS W/ TWL XL LVL3 (GOWN DISPOSABLE) ×1 IMPLANT
GOWN STRL REUS W/TWL LRG LVL3 (GOWN DISPOSABLE) ×2
GOWN STRL REUS W/TWL XL LVL3 (GOWN DISPOSABLE) ×1
NDL HYPO 25X1 1.5 SAFETY (NEEDLE) IMPLANT
NEEDLE HYPO 25X1 1.5 SAFETY (NEEDLE) ×2 IMPLANT
NS IRRIG 1000ML POUR BTL (IV SOLUTION) ×1 IMPLANT
PACK BASIN DAY SURGERY FS (CUSTOM PROCEDURE TRAY) ×1 IMPLANT
PAD CAST 3X4 CTTN HI CHSV (CAST SUPPLIES) IMPLANT
PADDING CAST COTTON 3X4 STRL (CAST SUPPLIES)
SLEEVE SCD COMPRESS KNEE MED (STOCKING) IMPLANT
SUT ETHILON 4 0 PS 2 18 (SUTURE) ×1 IMPLANT
SUT MNCRL AB 3-0 PS2 18 (SUTURE) IMPLANT
SUT VICRYL 4-0 PS2 18IN ABS (SUTURE) IMPLANT
SYR BULB EAR ULCER 3OZ GRN STR (SYRINGE) ×1 IMPLANT
SYR CONTROL 10ML LL (SYRINGE) IMPLANT
TOWEL GREEN STERILE FF (TOWEL DISPOSABLE) ×2 IMPLANT
UNDERPAD 30X36 HEAVY ABSORB (UNDERPADS AND DIAPERS) ×1 IMPLANT

## 2022-06-21 NOTE — Anesthesia Preprocedure Evaluation (Addendum)
Anesthesia Evaluation  Patient identified by MRN, date of birth, ID band Patient awake    Reviewed: Allergy & Precautions, NPO status , Patient's Chart, lab work & pertinent test results  Airway Mallampati: I  TM Distance: >3 FB Neck ROM: Full    Dental no notable dental hx. (+) Teeth Intact, Dental Advisory Given   Pulmonary Current Smoker and Patient abstained from smoking.   Pulmonary exam normal breath sounds clear to auscultation       Cardiovascular hypertension, Pt. on medications Normal cardiovascular exam Rhythm:Regular Rate:Normal     Neuro/Psych  Headaches  negative psych ROS   GI/Hepatic negative GI ROS, Neg liver ROS,,,  Endo/Other  negative endocrine ROS    Renal/GU negative Renal ROS  negative genitourinary   Musculoskeletal  (+) Arthritis ,    Abdominal   Peds  Hematology negative hematology ROS (+)   Anesthesia Other Findings   Reproductive/Obstetrics                             Anesthesia Physical Anesthesia Plan  ASA: 2  Anesthesia Plan: MAC   Post-op Pain Management: Tylenol PO (pre-op)*   Induction: Intravenous  PONV Risk Score and Plan: 1 and Propofol infusion, Treatment may vary due to age or medical condition, Midazolam, Ondansetron and Dexamethasone  Airway Management Planned: Natural Airway  Additional Equipment:   Intra-op Plan:   Post-operative Plan:   Informed Consent: I have reviewed the patients History and Physical, chart, labs and discussed the procedure including the risks, benefits and alternatives for the proposed anesthesia with the patient or authorized representative who has indicated his/her understanding and acceptance.     Dental advisory given  Plan Discussed with: CRNA  Anesthesia Plan Comments:        Anesthesia Quick Evaluation

## 2022-06-21 NOTE — Anesthesia Postprocedure Evaluation (Signed)
Anesthesia Post Note  Patient: Emily Baker  Procedure(s) Performed: Right middle finger digital mucous cyst excision with osteophyte debridement (Right: Middle Finger)     Patient location during evaluation: PACU Anesthesia Type: MAC Level of consciousness: awake and alert Pain management: pain level controlled Vital Signs Assessment: post-procedure vital signs reviewed and stable Respiratory status: spontaneous breathing, nonlabored ventilation, respiratory function stable and patient connected to nasal cannula oxygen Cardiovascular status: stable and blood pressure returned to baseline Postop Assessment: no apparent nausea or vomiting Anesthetic complications: no  No notable events documented.  Last Vitals:  Vitals:   06/21/22 0954 06/21/22 1004  BP:    Pulse: (!) 57 72  Resp: 15 16  Temp:  (!) 36.2 C  SpO2: 98% 97%    Last Pain:  Vitals:   06/21/22 1004  TempSrc:   PainSc: 0-No pain                 Kaytelyn Glore L Mavric Cortright

## 2022-06-21 NOTE — H&P (Signed)
HAND SURGERY   HPI: Patient is a 53 y.o. female who presents with a recurrent right middle finger digital mucous cyst.  She initially underwent cyst excision by another surgeon in [place].  The cyst returned shortly after surgery and is quite bothersome.  It is very painful and significantly interfering with patients life and ability to perform her daily tasks at work and home.  Patient denies any changes to their medical history or new systemic symptoms today.    Past Medical History:  Diagnosis Date   Arthritis    feet and hands   Family history of ovarian cancer    Patient's mother   Hypertension    Past Surgical History:  Procedure Laterality Date   BREAST BIOPSY Right    BREAST EXCISIONAL BIOPSY Right 2019   ENDOMETRIAL ABLATION  11/2007   FOOT SURGERY Bilateral    LAPAROSCOPY     X 3- Hx endometriosis per patient   TUBAL LIGATION     Social History   Socioeconomic History   Marital status: Married    Spouse name: Tyrone   Number of children: 4   Years of education: Not on file   Highest education level: Not on file  Occupational History   Occupation: Press photographer  Tobacco Use   Smoking status: Every Day    Packs/day: 0.50    Types: Cigarettes   Smokeless tobacco: Never  Vaping Use   Vaping Use: Never used  Substance and Sexual Activity   Alcohol use: Yes    Alcohol/week: 0.0 standard drinks of alcohol    Comment: occasional   Drug use: No   Sexual activity: Yes    Partners: Male    Birth control/protection: Surgical    Comment: uterine ablation  Other Topics Concern   Not on file  Social History Narrative   Not on file   Social Determinants of Health   Financial Resource Strain: Not on file  Food Insecurity: Not on file  Transportation Needs: Not on file  Physical Activity: Not on file  Stress: Not on file  Social Connections: Not on file   Family History  Problem Relation Age of Onset   Colon cancer Mother    Ovarian cancer Mother     Hypertension Mother    Cancer Mother        Ovarian cancer   Hyperlipidemia Brother    Hypertension Brother    Hyperlipidemia Brother    Hypertension Maternal Grandfather    Colon cancer Maternal Grandfather 60   Diabetes Mellitus II Paternal Grandmother    Healthy Child        x 4   Esophageal cancer Neg Hx    Stomach cancer Neg Hx    Rectal cancer Neg Hx    - negative except otherwise stated in the family history section No Known Allergies Prior to Admission medications   Medication Sig Start Date End Date Taking? Authorizing Provider  olmesartan (BENICAR) 20 MG tablet Take 1 tablet (20 mg total) by mouth daily. 07/27/21  Yes Nche, Charlene Brooke, NP  diclofenac (VOLTAREN) 75 MG EC tablet TAKE 1 TABLET BY MOUTH TWICE A DAY 06/20/22   McElwee, Lauren A, NP   No results found. - Positive ROS: All other systems have been reviewed and were otherwise negative with the exception of those mentioned in the HPI and as above.  Physical Exam: General: No acute distress, resting comfortably Cardiovascular: BUE warm and well perfused, normal rate Respiratory: Normal WOB on RA Skin:  Warm and dry Neurologic: Sensation intact distally Psychiatric: Patient is at baseline mood and affect  Right Upper Extremity  Cyst at radial aspect of distal middle finger just distal to DIP extension crease.  Area around cyst is tender to palpation.  No surrounding erythema or induration.  No other cysts, masses, or other lesions in the hand. SILT in m/u/r distribution and at radial and ulnar borders of middle finger All fingers warm and well perfused w/ BCR   Assessment: 52 yo F w/ recurrent right middle finger digital mucous cyst.   Plan: OR today for revision digital mucous cyst excision. We again reviewed the risks of surgery which include bleeding, infection, damage to neurovascular structures, persistent symptoms, damage to the terminal extensor tendon, cyst recurrent, delayed wound healing, need for  additional surgery.  Informed consent was signed.  All questions were answered.   Sherilyn Cooter, M.D. [facility] 8:35 AM

## 2022-06-21 NOTE — Op Note (Signed)
Date of Surgery: 06/21/2022  INDICATIONS: Patient is a 52 y.o.-year-old female with a recurrent right middle finger digital mucous cyst.  She previously underwent digital mucous cyst excision by an outside Psychologist, sport and exercise.  The cyst returned within a few months of the index surgery.  She presented to my office with a clear fluid-filled cyst at the dorsum of the middle finger at the ulnar aspect of the DIP extension crease.  The mass was quite symptomatic and interfering with her daily activities both at work and at home.  We reviewed the treatment options including observation, aspiration, or surgical excision with the patient electing to proceed with revision digital mucous cyst excision.  Risks, benefits, and alternatives to surgery were again discussed with the patient in the preoperative area. The patient wishes to proceed with surgery.  Informed consent was signed after our discussion.   PREOPERATIVE DIAGNOSIS:  Right middle finger digital mucous cyst  POSTOPERATIVE DIAGNOSIS: Same.  PROCEDURE:  Right middle finger digital mucous cyst excision (37048) Right middle finger DIP joint capsulectomy and osteophyte debridement (88916)   SURGEON: Audria Nine, M.D.  ASSIST: None  ANESTHESIA:  Local + MAC  IV FLUIDS AND URINE: See anesthesia.  ESTIMATED BLOOD LOSS: <5 mL.  IMPLANTS: * No implants in log *   DRAINS: None  COMPLICATIONS: None  DESCRIPTION OF PROCEDURE: The patient was met in the preoperative holding area where the surgical site was marked and the consent form was signed.  The patient was then taken to the operating room and a hand table applied to the right side of the stretcher.  All bony prominences were well padded. A formal time-out was performed to confirm that this was the correct patient, surgery, side, and site. Following formal timeout, a digital block was performed using 10 cc of 0.25% plain marcaine.   Monitored sedation was induced.  The operative extremity was  prepped and draped in the usual and sterile fashion.    Following a second formal timeout, 1/4 inch Penrose drain was passed around the base of the middle finger secured with a hemostat to serve as a finger tourniquet.  Given that this is a recurrent mucous cyst, I wanted to address both the radial and ulnar aspects of the DIP joint to prevent further recurrence.  An H shaped incision was designed over the dorsal aspect of the middle finger centered on the DIP joint.  Full-thickness skin flaps were elevated taking care to avoid injury to the underlying terminal extensor tendon.  Started on the ulnar side which is the side in which the cyst was located.  The interval between the terminal tendon and the collateral ligament was identified.  This capsular tissue in this interval was sharply excised in elliptical fashion using a 15 blade scalpel.  Mucinous fluid was encountered coming from this interval.  Following partial capsulectomy, a rongeur was used to debride any osteophyte from the dorsal base of the distal phalanx.  Any residual capsular tissue in this area was also sharply debrided using a rongeur.  Care was again taken to avoid injury to the terminal tendon.  I then turned my attention toward the radial aspect of the DIP joint.  Again the interval between the terminal tendon and the collateral ligament was identified.  This intervening capsular tissue was sharply excised in elliptical fashion using a 15 blade scalpel.  I then used a rongeur to debride the dorsal base of the distal phalanx and any remaining capsular tissue in this interval.  I  did not excise the thin skin in the area of the cyst as this can often lead to wound healing complications and should heal with excision and debridement of the cyst origin.  Point, the wound was thoroughly irrigated with copious sterile saline.  It was closed using a 4-0 nylon suture in simple interrupted fashion.  The wound was then dressed with bacitracin ointment,  Xeroform, 4 x 4, and loose Coban.  Prior to complete application of the dressing, the tourniquet was removed.  The fingertip was pink and well-perfused at the end of the procedure.  The patient was reversed from sedation.  The drapes were taken down.  All counts were correct x 2 at the end of the procedure.  The patient was then taken to the PACU in stable condition.   POSTOPERATIVE PLAN: She will be discharged to home with appropriate pain medication and discharge instructions.  I will see her back in approximately 10 to 14 days for her first postop visit.    Audria Nine, MD 9:34 AM

## 2022-06-21 NOTE — Transfer of Care (Signed)
Immediate Anesthesia Transfer of Care Note  Patient: Emily Baker  Procedure(s) Performed: Right middle finger digital mucous cyst excision with osteophyte debridement (Right: Middle Finger)  Patient Location: PACU  Anesthesia Type:MAC  Level of Consciousness: drowsy, patient cooperative, and responds to stimulation  Airway & Oxygen Therapy: Patient Spontanous Breathing and Patient connected to face mask oxygen  Post-op Assessment: Report given to RN and Post -op Vital signs reviewed and stable  Post vital signs: Reviewed and stable  Last Vitals:  Vitals Value Taken Time  BP    Temp    Pulse    Resp    SpO2      Last Pain:  Vitals:   06/21/22 0642  TempSrc: Oral  PainSc: 3       Patients Stated Pain Goal: 5 (09/81/19 1478)  Complications: No notable events documented.

## 2022-06-21 NOTE — Interval H&P Note (Signed)
History and Physical Interval Note:  06/21/2022 8:38 AM  Emily Baker  has presented today for surgery, with the diagnosis of Right middle finger digital mucous cyst.  The various methods of treatment have been discussed with the patient and family. After consideration of risks, benefits and other options for treatment, the patient has consented to  Procedure(s) with comments: Right middle finger digital mucous cyst excision with osteophyte debridement (Right) - 60 min local and MAC as a surgical intervention.  The patient's history has been reviewed, patient examined, no change in status, stable for surgery.  I have reviewed the patient's chart and labs.  Questions were answered to the patient's satisfaction.     Koralee Wedeking Estee Yohe

## 2022-06-21 NOTE — Discharge Instructions (Addendum)
Post Anesthesia Home Care Instructions  Activity: Get plenty of rest for the remainder of the day. A responsible individual must stay with you for 24 hours following the procedure.  For the next 24 hours, DO NOT: -Drive a car -Paediatric nurse -Drink alcoholic beverages -Take any medication unless instructed by your physician -Make any legal decisions or sign important papers.  Meals: Start with liquid foods such as gelatin or soup. Progress to regular foods as tolerated. Avoid greasy, spicy, heavy foods. If nausea and/or vomiting occur, drink only clear liquids until the nausea and/or vomiting subsides. Call your physician if vomiting continues.  Special Instructions/Symptoms: Your throat may feel dry or sore from the anesthesia or the breathing tube placed in your throat during surgery. If this causes discomfort, gargle with warm salt water. The discomfort should disappear within 24 hours.  If you had a scopolamine patch placed behind your ear for the management of post- operative nausea and/or vomiting:  1. The medication in the patch is effective for 72 hours, after which it should be removed.  Wrap patch in a tissue and discard in the trash. Wash hands thoroughly with soap and water. 2. You may remove the patch earlier than 72 hours if you experience unpleasant side effects which may include dry mouth, dizziness or visual disturbances. 3. Avoid touching the patch. Wash your hands with soap and water after contact with the patch.     Audria Nine, M.D. Hand Surgery  POST-OPERATIVE DISCHARGE INSTRUCTIONS   PRESCRIPTIONS: - You may have been given a prescription to be taken as directed for post-operative pain control.  You may also take over the counter ibuprofen/aleve and tylenol for pain. Take this as directed on the packaging. Do not exceed 3000 mg tylenol/acetaminophen in 24 hours.  Ibuprofen 600-800 mg (3-4) tablets by mouth every 6 hours as needed for pain.    OR  Aleve 2 tablets by mouth every 12 hours (twice daily) as needed for pain.   AND/OR  Tylenol 1000 mg (2 tablets) every 8 hours as needed for pain.  - Please use your pain medication carefully, as refills are limited and you may not be provided with one.  As stated above, please use over the counter pain medicine - it will also be helpful with decreasing your swelling.    ANESTHESIA: -After your surgery, post-surgical discomfort or pain is likely. This discomfort can last several days to a few weeks. At certain times of the day your discomfort may be more intense.   Did you receive a nerve block?   - A nerve block can provide pain relief for one hour to two days after your surgery. As long as the nerve block is working, you will experience little or no sensation in the area the surgeon operated on.  - As the nerve block wears off, you will begin to experience pain or discomfort. It is very important that you begin taking your prescribed pain medication before the nerve block fully wears off. Treating your pain at the first sign of the block wearing off will ensure your pain is better controlled and more tolerable when full-sensation returns. Do not wait until the pain is intolerable, as the medicine will be less effective. It is better to treat pain in advance than to try and catch up.   General Anesthesia:  If you did not receive a nerve block during your surgery, you will need to start taking your pain medication shortly after your surgery and should continue  to do so as prescribed by your surgeon.     ICE AND ELEVATION: - You may use ice for the first 48-72 hours, but it is not critical.   - Motion of your fingers is very important to decrease the swelling.  - Elevation, as much as possible for the next 48 hours, is critical for decreasing swelling as well as for pain relief. Elevation means when you are seated or lying down, you hand should be at or above your heart. When walking,  the hand needs to be at or above the level of your elbow.  - If the bandage gets too tight, it may need to be loosened. Please contact our office and we will instruct you in how to do this.    SURGICAL BANDAGES:  - Keep your dressing and/or splint clean and dry at all times.  You can remove your dressing 5-7 days from now and change with a dry dressing after. - You may place a plastic bag over your bandage to shower, but be careful, do not get your bandages wet.  - After the bandages have been removed, it is OK to get the stitches wet in a shower or with hand washing using warm, soapy water. Do not soak or submerge the wound yet. Please do not use lotions or creams on the stitches.      HAND THERAPY:  - You may not need any. If you do, we will begin this at your follow up visit in the clinic.    ACTIVITY AND WORK: - You are encouraged to move any fingers which are not in the bandage.  - Light use of the fingers is allowed to assist the other hand with daily hygiene and eating, but strong gripping or lifting is often uncomfortable and should be avoided.  - You might miss a variable period of time from work and hopefully this issue has been discussed prior to surgery. You may not do any heavy work with your affected hand for about 2 weeks.    [facility] [address], [address] [place], [place] [address] [phone]

## 2022-06-21 NOTE — Anesthesia Procedure Notes (Signed)
Procedure Name: MAC Date/Time: 06/21/2022 8:47 AM  Performed by: Glory Buff, CRNAOxygen Delivery Method: Simple face mask

## 2022-06-22 ENCOUNTER — Encounter (HOSPITAL_BASED_OUTPATIENT_CLINIC_OR_DEPARTMENT_OTHER): Payer: Self-pay | Admitting: Orthopedic Surgery

## 2022-06-27 ENCOUNTER — Encounter: Payer: Self-pay | Admitting: Family Medicine

## 2022-07-05 ENCOUNTER — Telehealth: Payer: Self-pay | Admitting: Nurse Practitioner

## 2022-07-05 DIAGNOSIS — I10 Essential (primary) hypertension: Secondary | ICD-10-CM

## 2022-07-05 NOTE — Telephone Encounter (Signed)
Caller Name: Charlett Call back phone #: [phone]   MEDICATION(S):  olmesartan (BENICAR) 20 MG tablet   Days of Med Remaining: 12 days  Scheduled for CPE 08/25/2022, first available, added pt to waitlist  Has the patient contacted their pharmacy (YES/NO)? Changed pharmacy  [facility]  [id number] [place] [place]: [phone] Fax: [phone]

## 2022-07-06 MED ORDER — OLMESARTAN MEDOXOMIL 20 MG PO TABS: 20.0000 mg | ORAL_TABLET | Freq: Every day | ORAL | 3 refills | Status: DC

## 2022-07-06 NOTE — Telephone Encounter (Signed)
Chart supports Rx Last OV: 05/2022 Next OV: 08/2022

## 2022-07-10 ENCOUNTER — Other Ambulatory Visit: Payer: Self-pay | Admitting: Nurse Practitioner

## 2022-07-10 DIAGNOSIS — I10 Essential (primary) hypertension: Secondary | ICD-10-CM

## 2022-07-12 ENCOUNTER — Other Ambulatory Visit: Payer: Self-pay

## 2022-07-12 DIAGNOSIS — I10 Essential (primary) hypertension: Secondary | ICD-10-CM

## 2022-07-12 MED ORDER — OLMESARTAN MEDOXOMIL 20 MG PO TABS: 20.0000 mg | ORAL_TABLET | Freq: Every day | ORAL | 0 refills | Status: DC

## 2022-07-27 ENCOUNTER — Telehealth: Payer: Self-pay | Admitting: Nurse Practitioner

## 2022-07-27 ENCOUNTER — Other Ambulatory Visit: Payer: Self-pay

## 2022-07-27 DIAGNOSIS — I10 Essential (primary) hypertension: Secondary | ICD-10-CM

## 2022-07-27 MED ORDER — OLMESARTAN MEDOXOMIL 20 MG PO TABS: 20.0000 mg | ORAL_TABLET | Freq: Every day | ORAL | 0 refills | Status: DC

## 2022-07-27 NOTE — Telephone Encounter (Signed)
Sent refill into the pharmacy [facility] , called and informed pt

## 2022-07-27 NOTE — Telephone Encounter (Signed)
[facility]. No [facility]. She only has 5 [pills left of her BP med.

## 2022-08-25 ENCOUNTER — Ambulatory Visit: Payer: Managed Care, Other (non HMO) | Admitting: Nurse Practitioner

## 2022-08-25 ENCOUNTER — Encounter: Payer: Self-pay | Admitting: Nurse Practitioner

## 2022-08-25 VITALS — BP 110/78 | HR 82 | Temp 98.3°F | Resp 16 | Ht 63.0 in | Wt 154.2 lb

## 2022-08-25 DIAGNOSIS — Z720 Tobacco use: Secondary | ICD-10-CM | POA: Diagnosis not present

## 2022-08-25 DIAGNOSIS — Z13220 Encounter for screening for lipoid disorders: Secondary | ICD-10-CM

## 2022-08-25 DIAGNOSIS — Z0001 Encounter for general adult medical examination with abnormal findings: Secondary | ICD-10-CM | POA: Diagnosis not present

## 2022-08-25 DIAGNOSIS — I10 Essential (primary) hypertension: Secondary | ICD-10-CM

## 2022-08-25 DIAGNOSIS — Z23 Encounter for immunization: Secondary | ICD-10-CM

## 2022-08-25 MED ORDER — VARENICLINE TARTRATE 0.5 MG PO TABS: ORAL_TABLET | ORAL | 0 refills | Status: DC

## 2022-08-25 MED ORDER — OLMESARTAN MEDOXOMIL 20 MG PO TABS: 20.0000 mg | ORAL_TABLET | Freq: Every day | ORAL | 3 refills | Status: DC

## 2022-08-25 NOTE — Assessment & Plan Note (Signed)
Onset at age 52, previously smokedd 1ppd, currently smokes 1ppd Quit x 2weeks with use of chantix. Stopped med due to fear of possible side effects. States she want to try med again  Chantix rx sent F/up in 1month

## 2022-08-25 NOTE — Patient Instructions (Addendum)
Try estroven supplement or magnesium supplement 250-'400mg'$  at bedtime  and ashwaghanda supplement. Start chantix 1week prior to quit date. Schedule fasting lab appt. Need to be fasting 8hrs prior to blood draw. Ok to drink water and take BP meds. Continue Heart healthy diet and daily exercise. Maintain current medications.  Preventive Care 65-51 Years Old, Female Preventive care refers to lifestyle choices and visits with your health care provider that can promote health and wellness. Preventive care visits are also called wellness exams. What can I expect for my preventive care visit? Counseling Your health care provider may ask you questions about your: Medical history, including: Past medical problems. Family medical history. Pregnancy history. Current health, including: Menstrual cycle. Method of birth control. Emotional well-being. Home life and relationship well-being. Sexual activity and sexual health. Lifestyle, including: Alcohol, nicotine or tobacco, and drug use. Access to firearms. Diet, exercise, and sleep habits. Work and work Statistician. Sunscreen use. Safety issues such as seatbelt and bike helmet use. Physical exam Your health care provider will check your: Height and weight. These may be used to calculate your BMI (body mass index). BMI is a measurement that tells if you are at a healthy weight. Waist circumference. This measures the distance around your waistline. This measurement also tells if you are at a healthy weight and may help predict your risk of certain diseases, such as type 2 diabetes and high blood pressure. Heart rate and blood pressure. Body temperature. Skin for abnormal spots. What immunizations do I need?  Vaccines are usually given at various ages, according to a schedule. Your health care provider will recommend vaccines for you based on your age, medical history, and lifestyle or other factors, such as travel or where you work. What tests  do I need? Screening Your health care provider may recommend screening tests for certain conditions. This may include: Lipid and cholesterol levels. Diabetes screening. This is done by checking your blood sugar (glucose) after you have not eaten for a while (fasting). Pelvic exam and Pap test. Hepatitis B test. Hepatitis C test. HIV (human immunodeficiency virus) test. STI (sexually transmitted infection) testing, if you are at risk. Lung cancer screening. Colorectal cancer screening. Mammogram. Talk with your health care provider about when you should start having regular mammograms. This may depend on whether you have a family history of breast cancer. BRCA-related cancer screening. This may be done if you have a family history of breast, ovarian, tubal, or peritoneal cancers. Bone density scan. This is done to screen for osteoporosis. Talk with your health care provider about your test results, treatment options, and if necessary, the need for more tests. Follow these instructions at home: Eating and drinking  Eat a diet that includes fresh fruits and vegetables, whole grains, lean protein, and low-fat dairy products. Take vitamin and mineral supplements as recommended by your health care provider. Do not drink alcohol if: Your health care provider tells you not to drink. You are pregnant, may be pregnant, or are planning to become pregnant. If you drink alcohol: Limit how much you have to 0-1 drink a day. Know how much alcohol is in your drink. In the [place] one drink equals one 12 oz bottle of beer (355 mL), one 5 oz glass of wine (148 mL), or one 1 oz glass of hard liquor (44 mL). Lifestyle Brush your teeth every morning and night with fluoride toothpaste. Floss one time each day. Exercise for at least 30 minutes 5 or more days each week. Do  not use any products that contain nicotine or tobacco. These products include cigarettes, chewing tobacco, and vaping devices, such as  e-cigarettes. If you need help quitting, ask your health care provider. Do not use drugs. If you are sexually active, practice safe sex. Use a condom or other form of protection to prevent STIs. If you do not wish to become pregnant, use a form of birth control. If you plan to become pregnant, see your health care provider for a prepregnancy visit. Take aspirin only as told by your health care provider. Make sure that you understand how much to take and what form to take. Work with your health care provider to find out whether it is safe and beneficial for you to take aspirin daily. Find healthy ways to manage stress, such as: Meditation, yoga, or listening to music. Journaling. Talking to a trusted person. Spending time with friends and family. Minimize exposure to UV radiation to reduce your risk of skin cancer. Safety Always wear your seat belt while driving or riding in a vehicle. Do not drive: If you have been drinking alcohol. Do not ride with someone who has been drinking. When you are tired or distracted. While texting. If you have been using any mind-altering substances or drugs. Wear a helmet and other protective equipment during sports activities. If you have firearms in your house, make sure you follow all gun safety procedures. Seek help if you have been physically or sexually abused. What's next? Visit your health care provider once a year for an annual wellness visit. Ask your health care provider how often you should have your eyes and teeth checked. Stay up to date on all vaccines. This information is not intended to replace advice given to you by your health care provider. Make sure you discuss any questions you have with your health care provider. Document Revised: 12/01/2020 Document Reviewed: 12/01/2020 Elsevier Patient Education  [place].

## 2022-08-25 NOTE — Assessment & Plan Note (Signed)
BP at goal BP Readings from Last 3 Encounters:  08/25/22 110/78  06/21/22 128/72  06/05/22 124/80   Repeat BMP Maintain benicar dose

## 2022-08-25 NOTE — Progress Notes (Signed)
Complete physical exam  Patient: Emily Baker   DOB: 03/10/1971   52 y.o. Female  MRN: [id number] Visit Date: 08/25/2022  Subjective:    Chief Complaint  Patient presents with   Annual Exam    Not fasting    Emily Baker is a 52 y.o. female who presents today for a complete physical exam. She reports consuming a general diet.  Cardio and weight training daily  She generally feels well. She reports sleeping fairly well. She does have additional problems to discuss today.  Vision:Yes Dental:Yes STD Screen:No  BP Readings from Last 3 Encounters:  08/25/22 110/78  06/21/22 128/72  06/05/22 124/80   Wt Readings from Last 3 Encounters:  08/25/22 154 lb 3.2 oz (69.9 kg)  06/21/22 157 lb 3 oz (71.3 kg)  06/05/22 154 lb 6.4 oz (70 kg)   Most recent fall risk assessment:    08/25/2022    1:02 PM  [place] in the past year? 0  Number falls in past yr: 0  Injury with Fall? 0  Risk for fall due to : No Fall Risks  Follow up Falls evaluation completed   Depression screen:Yes - No Depression  Most recent depression screenings:    08/25/2022    1:02 PM 06/05/2022    8:33 AM  PHQ 2/9 Scores  PHQ - 2 Score 0 0  PHQ- 9 Score  1   HPI  Benign essential HTN BP at goal BP Readings from Last 3 Encounters:  08/25/22 110/78  06/21/22 128/72  06/05/22 124/80   Repeat BMP Maintain benicar dose  Tobacco abuse disorder Onset at age 32, previously smokedd 1ppd, currently smokes 1ppd Quit x 2weeks with use of chantix. Stopped med due to fear of possible side effects. States she want to try med again  Chantix rx sent F/up in 25month  Past Medical History:  Diagnosis Date   Arthritis    feet and hands   Family history of ovarian cancer    Patient's mother   Hypertension    Pyogenic arthritis of right hand ([place] 01/12/2022   Past Surgical History:  Procedure Laterality Date   BREAST BIOPSY Right    BREAST EXCISIONAL BIOPSY Right 2019   CYST EXCISION  Right 06/21/2022   Procedure: Right middle finger digital mucous cyst excision with osteophyte debridement;  Surgeon: BSherilyn Cooter MD;  [facility]  Service: Orthopedics;  Laterality: Right;  60 min local and MAC   ENDOMETRIAL ABLATION  11/2007   FOOT SURGERY Bilateral    LAPAROSCOPY     X 3- Hx endometriosis per patient   TUBAL LIGATION     Social History   Socioeconomic History   Marital status: Married    Spouse name: Tyrone   Number of children: 4   Years of education: Not on file   Highest education level: Not on file  Occupational History   Occupation: MPress photographer Tobacco Use   Smoking status: Every Day    Packs/day: 0.50    Years: 44.00    Total pack years: 22.00    Types: Cigarettes   Smokeless tobacco: Never  Vaping Use   Vaping Use: Never used  Substance and Sexual Activity   Alcohol use: Yes    Alcohol/week: 0.0 standard drinks of alcohol    Comment: occasional   Drug use: No   Sexual activity: Yes    Partners: Male    Birth control/protection: Surgical  Comment: uterine ablation  Other Topics Concern   Not on file  Social History Narrative   Not on file   Social Determinants of Health   Financial Resource Strain: Not on file  Food Insecurity: Not on file  Transportation Needs: Not on file  Physical Activity: Not on file  Stress: Not on file  Social Connections: Not on file  Intimate Partner Violence: Not on file   Family Status  Relation Name Status   Mother  Alive   Father  Alive   Brother  Alive   Brother  Alive   MGF  Deceased   Bovey  Deceased   PGF  Deceased   Daughter  Alive   Daughter  Alive   Daughter  Alive   Daughter  Alive   Child  Deceased   Neg Hx  (Not Specified)   Family History  Problem Relation Age of Onset   Colon cancer Mother    Ovarian cancer Mother    Hypertension Mother    Cancer Mother        Ovarian cancer   Hyperlipidemia Brother    Hypertension Brother     Hyperlipidemia Brother    Hypertension Maternal Grandfather    Colon cancer Maternal Grandfather 60   Diabetes Mellitus II Paternal Grandmother    Healthy Child        x 4   Esophageal cancer Neg Hx    Stomach cancer Neg Hx    Rectal cancer Neg Hx    No Known Allergies  Patient Care Team: Stewart Pimenta, Charlene Brooke, NP as PCP - General (Internal Medicine)   Medications: Outpatient Medications Prior to Visit  Medication Sig   diclofenac (VOLTAREN) 75 MG EC tablet TAKE 1 TABLET BY MOUTH TWICE A DAY   [DISCONTINUED] olmesartan (BENICAR) 20 MG tablet Take 1 tablet (20 mg total) by mouth daily.   No facility-administered medications prior to visit.    Review of Systems  Constitutional:  Negative for activity change, appetite change and unexpected weight change.  Respiratory: Negative.    Cardiovascular: Negative.   Gastrointestinal: Negative.   Endocrine: Negative for cold intolerance and heat intolerance.  Genitourinary: Negative.   Musculoskeletal: Negative.   Skin: Negative.   Neurological: Negative.   Hematological: Negative.   Psychiatric/Behavioral:  Negative for behavioral problems, decreased concentration, dysphoric mood, hallucinations, self-injury, sleep disturbance and suicidal ideas. The patient is not nervous/anxious.         Objective:  BP 110/78 (BP Location: Left Arm, Patient Position: Sitting, Cuff Size: Normal)   Pulse 82   Temp 98.3 F (36.8 C) (Temporal)   Resp 16   Ht '5\' 3"'$  (1.6 m)   Wt 154 lb 3.2 oz (69.9 kg)   SpO2 99%   BMI 27.32 kg/m     Physical Exam Vitals and nursing note reviewed.  Constitutional:      General: She is not in acute distress. HENT:     Right Ear: Tympanic membrane, ear canal and external ear normal.     Left Ear: Tympanic membrane, ear canal and external ear normal.     Nose: Nose normal.  Eyes:     Extraocular Movements: Extraocular movements intact.     Conjunctiva/sclera: Conjunctivae normal.     Pupils: Pupils are  equal, round, and reactive to light.  Neck:     Thyroid: No thyroid mass, thyromegaly or thyroid tenderness.  Cardiovascular:     Rate and Rhythm: Normal rate and regular rhythm.  Pulses: Normal pulses.     Heart sounds: Normal heart sounds.  Pulmonary:     Effort: Pulmonary effort is normal.     Breath sounds: Normal breath sounds.  Abdominal:     General: Bowel sounds are normal.     Palpations: Abdomen is soft.  Genitourinary:    Comments: Breast and pelvic exam to GYN per patient Musculoskeletal:        General: Normal range of motion.     Cervical back: Normal range of motion and neck supple.     Right lower leg: No edema.     Left lower leg: No edema.  Lymphadenopathy:     Cervical: No cervical adenopathy.  Skin:    General: Skin is warm and dry.  Neurological:     Mental Status: She is alert and oriented to person, place, and time.     Cranial Nerves: No cranial nerve deficit.  Psychiatric:        Mood and Affect: Mood normal.        Behavior: Behavior normal.        Thought Content: Thought content normal.     No results found for any visits on 08/25/22.    Assessment & Plan:    Routine Health Maintenance and Physical Exam  Immunization History  Administered Date(s) Administered   Influenza,inj,Quad PF,6+ Mos 05/17/2021, 06/05/2022   Influenza,inj,Quad PF,6-35 Mos 03/26/2018   Influenza-Unspecified 08/16/2017   Pneumococcal Polysaccharide-23 10/22/2015   Tdap 11/17/2005, 10/18/2015, 10/22/2015   Zoster Recombinat (Shingrix) 08/25/2022   Health Maintenance  Topic Date Due   COVID-19 Vaccine (1) 09/10/2022 (Originally 01/02/1971)   Zoster Vaccines- Shingrix (2 of 2) 10/20/2022   MAMMOGRAM  08/11/2023   PAP SMEAR-Modifier  04/11/2024   DTaP/Tdap/Td (4 - Td or Tdap) 10/21/2025   COLONOSCOPY (Pts 45-15yr Insurance coverage will need to be confirmed)  02/16/2026   INFLUENZA VACCINE  Completed   Hepatitis C Screening  Completed   HIV Screening  Completed    HPV VACCINES  Aged Out    Discussed health benefits of physical activity, and encouraged her to engage in regular exercise appropriate for her age and condition.  Problem List Items Addressed This Visit       Cardiovascular and Mediastinum   Benign essential HTN    BP at goal BP Readings from Last 3 Encounters:  08/25/22 110/78  06/21/22 128/72  06/05/22 124/80  Repeat BMP Maintain benicar dose      Relevant Medications   olmesartan (BENICAR) 20 MG tablet     Other   Microcalcification of right breast on mammogram   Tobacco abuse disorder    Onset at age 52 previously smokedd 1ppd, currently smokes 1ppd Quit x 2weeks with use of chantix. Stopped med due to fear of possible side effects. States she want to try med again  Chantix rx sent F/up in 112month    Relevant Medications   varenicline (CHANTIX) 0.5 MG tablet   Other Visit Diagnoses     Encounter for preventative adult health care exam with abnormal findings    -  Primary   Relevant Orders   Comprehensive metabolic panel   Need for shingles vaccine       Encounter for lipid screening for cardiovascular disease       Relevant Orders   Lipid panel   Immunization due          Return in about 4 weeks (around 09/22/2022) for tobacco cessation.  Wilfred Lacy, NP

## 2022-08-30 ENCOUNTER — Other Ambulatory Visit (INDEPENDENT_AMBULATORY_CARE_PROVIDER_SITE_OTHER): Payer: Managed Care, Other (non HMO)

## 2022-08-30 DIAGNOSIS — Z136 Encounter for screening for cardiovascular disorders: Secondary | ICD-10-CM | POA: Diagnosis not present

## 2022-08-30 DIAGNOSIS — Z13220 Encounter for screening for lipoid disorders: Secondary | ICD-10-CM

## 2022-08-30 DIAGNOSIS — Z0001 Encounter for general adult medical examination with abnormal findings: Secondary | ICD-10-CM | POA: Diagnosis not present

## 2022-08-30 LAB — COMPREHENSIVE METABOLIC PANEL
ALT: 12 U/L (ref 0–35)
AST: 16 U/L (ref 0–37)
Albumin: 4.3 g/dL (ref 3.5–5.2)
Alkaline Phosphatase: 66 U/L (ref 39–117)
BUN: 10 mg/dL (ref 6–23)
CO2: 30 mEq/L (ref 19–32)
Calcium: 9.6 mg/dL (ref 8.4–10.5)
Chloride: 102 mEq/L (ref 96–112)
Creatinine, Ser: 0.68 mg/dL (ref 0.40–1.20)
GFR: 101.03 mL/min (ref >60.00)
Glucose, Bld: 75 mg/dL (ref 70–99)
Potassium: 4.4 mEq/L (ref 3.5–5.1)
Sodium: 139 mEq/L (ref 135–145)
Total Bilirubin: 0.3 mg/dL (ref 0.2–1.2)
Total Protein: 7.1 g/dL (ref 6.0–8.3)

## 2022-08-30 LAB — LIPID PANEL
Cholesterol: 182 mg/dL (ref 0–200)
HDL: 61.4 mg/dL (ref >39.00)
LDL Cholesterol: 96 mg/dL (ref 0–99)
NonHDL: 121.01
Total CHOL/HDL Ratio: 3
Triglycerides: 127 mg/dL (ref 0.0–149.0)
VLDL: 25.4 mg/dL (ref 0.0–40.0)

## 2022-08-30 NOTE — Progress Notes (Signed)
Pt is here for labs   

## 2022-08-31 NOTE — Progress Notes (Signed)
Stable Follow instructions as discussed during office visit.

## 2022-09-14 ENCOUNTER — Other Ambulatory Visit: Payer: Self-pay | Admitting: Nurse Practitioner

## 2022-09-14 DIAGNOSIS — Z720 Tobacco use: Secondary | ICD-10-CM

## 2022-09-20 ENCOUNTER — Other Ambulatory Visit (HOSPITAL_COMMUNITY)
Admission: RE | Admit: 2022-09-20 | Discharge: 2022-09-20 | Disposition: A | Discharge status: Home / Self Care | Payer: Managed Care, Other (non HMO) | Source: Ambulatory Visit | Attending: Obstetrics & Gynecology | Admitting: Obstetrics & Gynecology

## 2022-09-20 ENCOUNTER — Encounter: Payer: Self-pay | Admitting: Obstetrics & Gynecology

## 2022-09-20 ENCOUNTER — Ambulatory Visit: Payer: Managed Care, Other (non HMO) | Admitting: Obstetrics & Gynecology

## 2022-09-20 VITALS — BP 133/90 | HR 89 | Ht 63.0 in | Wt 155.0 lb

## 2022-09-20 DIAGNOSIS — Z01419 Encounter for gynecological examination (general) (routine) without abnormal findings: Secondary | ICD-10-CM | POA: Insufficient documentation

## 2022-09-20 DIAGNOSIS — N951 Menopausal and female climacteric states: Secondary | ICD-10-CM

## 2022-09-20 DIAGNOSIS — R102 Pelvic and perineal pain: Secondary | ICD-10-CM

## 2022-09-20 MED ORDER — PAROXETINE HCL 10 MG PO TABS: 10.0000 mg | ORAL_TABLET | Freq: Every day | ORAL | 3 refills | Status: DC

## 2022-09-20 NOTE — Progress Notes (Signed)
Pt presents for AEX Last PAP 04-11-21, family Hx of cervical cancer Last [facility]ogram 08-02-22 [facility], normal Colonoscopy 02-16-21 Declines STD testing Pt reports intermittent pain in the right groin randomly and with intercourse. No other  intercourse.

## 2022-09-20 NOTE — Progress Notes (Signed)
Subjective:     Emily Baker is a 52 y.o. female here for a routine exam.  Current complaints: Pt reports some pain on her right side. She does not attribute this to any life changes. It does not limit her ADLs.   She is not taking meds for this pain.     Pt does report hot flushes. She does use tobacco however, she desires to stop.       Gynecologic History No LMP recorded. Patient has had an ablation. Contraception: tubal ligation Last Pap: 08/14/2018. Results were: normal Last mammogram: 08/10/2021. Results were: normal  Obstetric History OB History  Gravida Para Term Preterm AB Living  4 4 4         SAB IAB Ectopic Multiple Live Births               # Outcome Date GA Lbr Len/2nd Weight Sex Delivery Anes PTL Lv  4 Term      Vag-Spont     3 Term      Vag-Spont     2 Term      Vag-Spont     1 Term      Vag-Spont      The following portions of the patient's history were reviewed and updated as appropriate: allergies, current medications, past family history, past medical history, past social history, past surgical history, and problem list.  Review of Systems Pertinent items are noted in HPI.    Objective:  BP (!) 133/90   Pulse 89   Ht 5\' 3"  (1.6 m)   Wt 155 lb (70.3 kg)   BMI 27.46 kg/m  General Appearance:    Alert, cooperative, no distress, appears stated age  Head:    Normocephalic, without obvious abnormality, atraumatic  Eyes:    conjunctiva/corneas clear, EOM's intact, both eyes  Ears:    Normal external ear canals, both ears  Nose:   Nares normal, septum midline, mucosa normal, no drainage    or sinus tenderness  Throat:   Lips, mucosa, and tongue normal; teeth and gums normal  Neck:   Supple, symmetrical, trachea midline, no adenopathy;    thyroid:  no enlargement/tenderness/nodules  Back:     Symmetric, no curvature, ROM normal, no CVA tenderness  Lungs:     respirations unlabored  Chest Wall:    No tenderness or deformity   Heart:    Regular rate and  rhythm  Breast Exam:    No tenderness, masses, or nipple abnormality  Abdomen:     Soft, non-tender, bowel sounds active all four quadrants,    no masses, no organomegaly  Genitalia:    Normal female without lesion, discharge or tenderness   No pelvic masses noted. Reported pain is not reproducible.   Extremities:   Extremities normal, atraumatic, no cyanosis or edema  Pulses:   2+ and symmetric all extremities  Skin:   Skin color, texture, turgor normal, no rashes or lesions    Assessment:    Healthy female exam.  Hot flushes + tob use. Reviewed nonhormonal options.  Plan:  Diagnoses and all orders for this visit:  Well female exam with routine gynecological exam -     Cytology - PAP( [facility])  Menopausal hot flushes  Pelvic pain  Other orders -     PARoxetine (PAXIL) 10 MG tablet; Take 1 tablet (10 mg total) by mouth daily.   F/u in 3 months for med changes F/u in 1 year for annual GYN F/u sooner  prn   Gerrica Cygan L. Harraway-Smith, M.D., [place]

## 2022-09-21 ENCOUNTER — Ambulatory Visit: Payer: Managed Care, Other (non HMO) | Admitting: Nurse Practitioner

## 2022-09-22 LAB — CYTOLOGY - PAP
Comment: NEGATIVE
Diagnosis: NEGATIVE
High risk HPV: NEGATIVE

## 2022-10-01 ENCOUNTER — Encounter: Payer: Self-pay | Admitting: Obstetrics & Gynecology

## 2022-10-02 ENCOUNTER — Ambulatory Visit: Payer: Managed Care, Other (non HMO) | Admitting: Nurse Practitioner

## 2022-10-16 ENCOUNTER — Other Ambulatory Visit: Payer: Self-pay

## 2022-10-16 MED ORDER — PAROXETINE HCL 10 MG PO TABS: 10.0000 mg | ORAL_TABLET | Freq: Every day | ORAL | 3 refills | Status: DC

## 2022-10-16 NOTE — Progress Notes (Signed)
90 day prescription request received from [facility] in [facility] [address]. Armandina Stammer RN

## 2023-01-29 ENCOUNTER — Telehealth: Payer: Managed Care, Other (non HMO) | Admitting: Physician Assistant

## 2023-01-29 DIAGNOSIS — J069 Acute upper respiratory infection, unspecified: Secondary | ICD-10-CM

## 2023-01-29 MED ORDER — FLUTICASONE PROPIONATE 50 MCG/ACT NA SUSP: 2.0000 | Freq: Every day | NASAL | 0 refills | Status: DC

## 2023-01-29 MED ORDER — NAPROXEN 500 MG PO TABS: 500.0000 mg | ORAL_TABLET | Freq: Two times a day (BID) | ORAL | 0 refills | Status: DC

## 2023-01-29 NOTE — Progress Notes (Signed)
E-Visit for [employer] Virus / COVID Screening  Your current symptoms could be consistent with COVID.  Please complete a Covid test either at home or check with your local pharmacy to see if they provide testing.    You have tested positive for COVID-19, meaning that you were infected with the novel coronavirus and could give the virus to others.  Most people with COVID-19 have mild illness and can recover at home without medical care. Do not leave your home, except to get medical care. Do not visit public areas and do not go to places where you are unable to wear a mask. It is important that you stay home  to take care for yourself and to help protect other people in your home and community.      Isolation Instructions:   You are to isolate at home until you have been fever free for at least 24 hours without a fever-reducing medication, and symptoms have been steadily improving for 24 hours. At that time,  you can end isolation but need to mask for an additional 5 days.  If you must be around other household members who do not have symptoms, you need to make sure that both you and the family members are masking consistently with a high-quality mask.  If you note any worsening of symptoms despite treatment, please seek an in-person evaluation ASAP. If you note any significant shortness of breath or any chest pain, please seek ER evaluation. Please do not delay care!  Go to the nearest hospital ED for assessment if fever/cough/breathlessness are severe or illness seems like a threat to life.    The following symptoms may appear 2-14 days after exposure: Fever Cough Shortness of breath or difficulty breathing Chills Repeated shaking with chills Muscle pain Headache Sore throat New loss of taste or smell Fatigue Congestion or runny nose Nausea or vomiting Diarrhea  You can use medication such as prescription anti-inflammatory called Naprosyn 500 mg. Take twice daily as needed for fever or  body aches for 2 weeks and prescription for Fluticasone nasal spray 2 sprays in each nostril one time per day  You may also take acetaminophen (Tylenol) as needed for fever.  HOME CARE Only take medications as instructed by your medical team. Drink plenty of fluids and get plenty of rest. A steam or ultrasonic humidifier can help if you have congestion.  GET HELP RIGHT AWAY IF YOU HAVE EMERGENCY WARNING SIGNS.  Call 911 or proceed to your closest emergency facility if: You develop worsening high fever. Trouble breathing Bluish lips or face Persistent pain or pressure in the chest New confusion Inability to wake or stay awake You cough up blood. Your symptoms become more severe Inability to hold down food or fluids  This list is not all possible symptoms. Contact your medical provider for any symptoms that are severe or concerning to you.   Your e-visit answers were reviewed by a board certified advanced clinical practitioner to complete your personal care plan.  Depending on the condition, your plan could have included both over the counter or prescription medications.  If there is a problem, please reply once you have received a response from your provider.  Your safety is important to [place].  If you have drug allergies check your prescription carefully.    You can use MyChart to ask questions about today's visit, request a non-urgent call back, or ask for a work or school excuse for 24 hours related to this e-Visit. If it  has been greater than 24 hours you will need to follow up with your provider or enter a new e-Visit to address those concerns. You will get an e-mail in the next two days asking about your experience.  I hope that your e-visit has been valuable and will speed your recovery. Thank you for using e-visits.  I have spent 5 minutes in review of e-visit questionnaire, review and updating patient chart, medical decision making and response to patient.   Margaretann Loveless,  PA-C

## 2023-04-25 IMAGING — DX DG CHEST 2V
2 series · 2 of 2 positions shown · non-contrast
Comparison: 09/16/2008

CLINICAL DATA: Chest pain

EXAM:
CHEST - 2 VIEW

[chest lat]
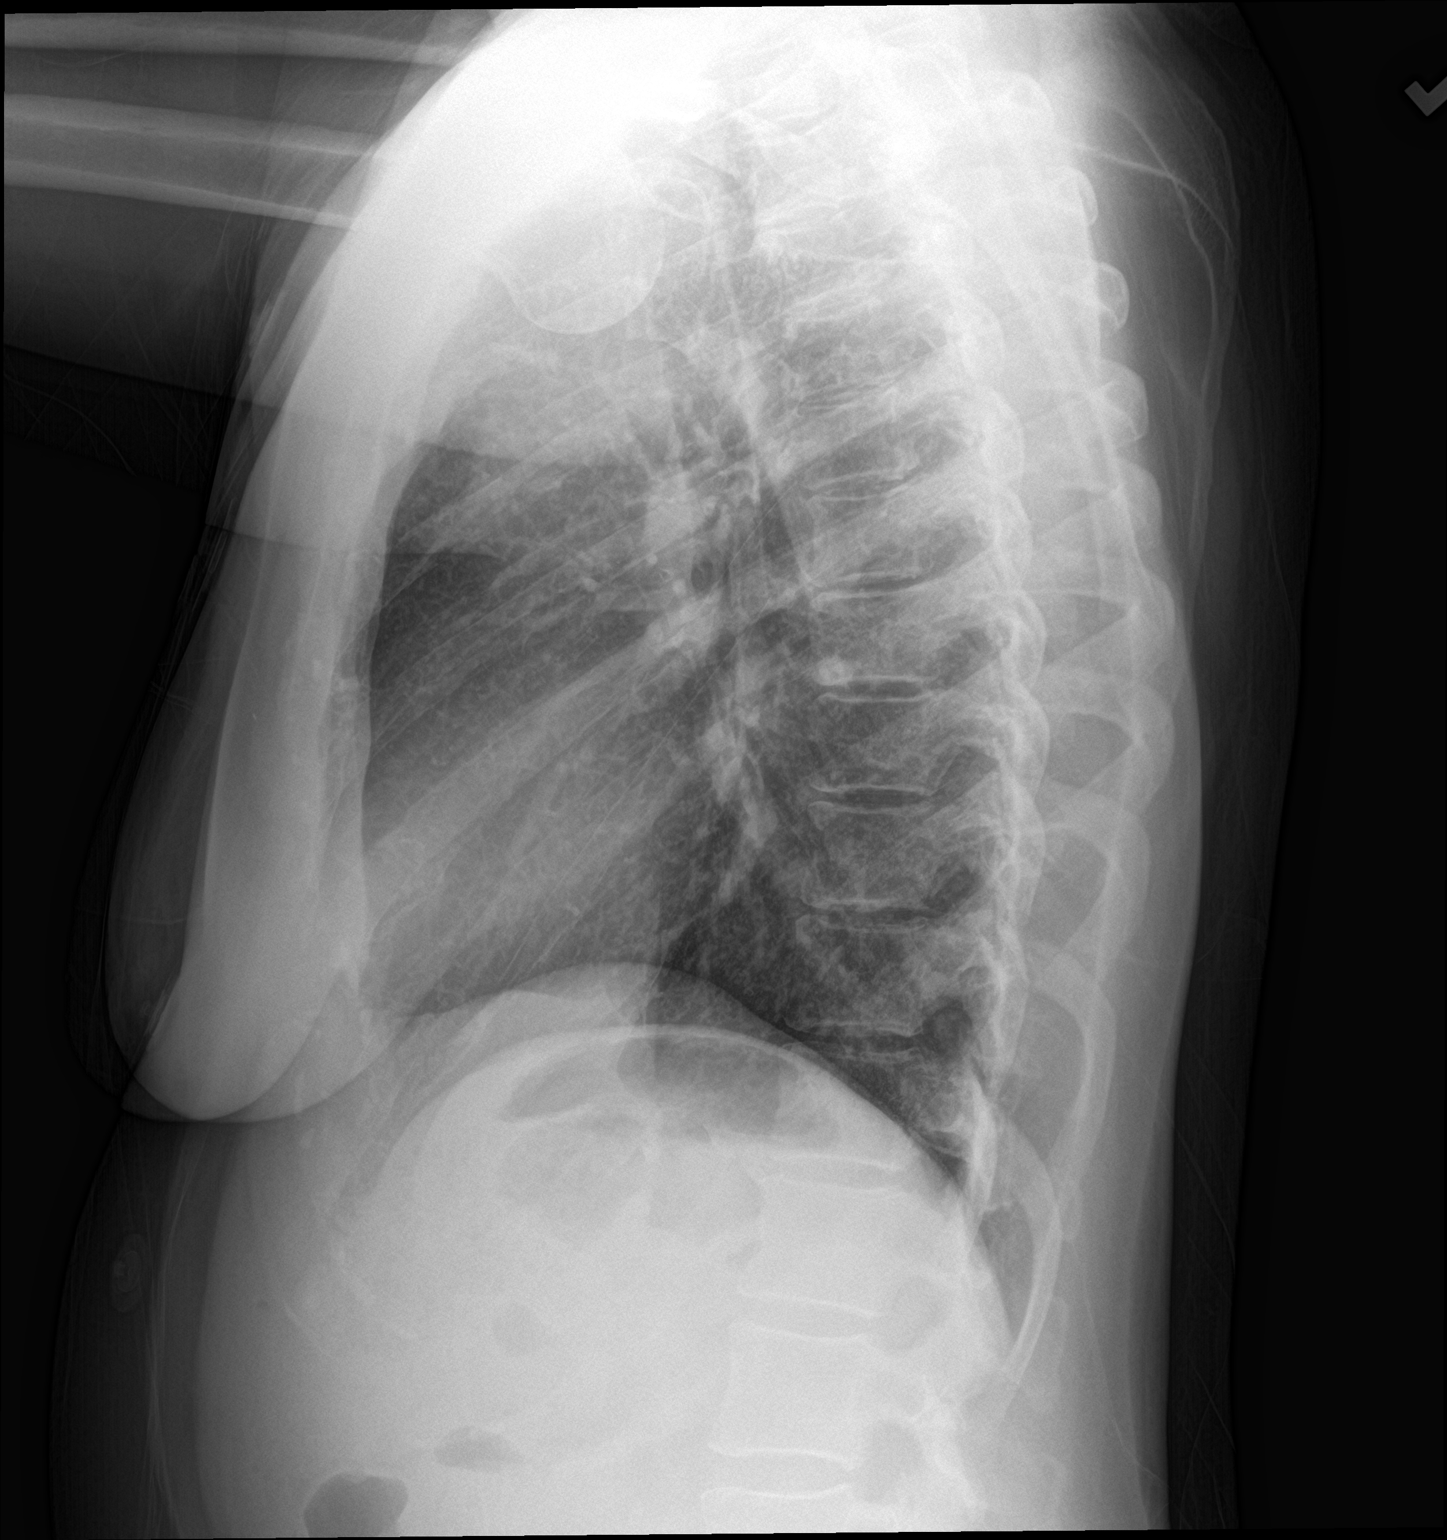

[chest pa]
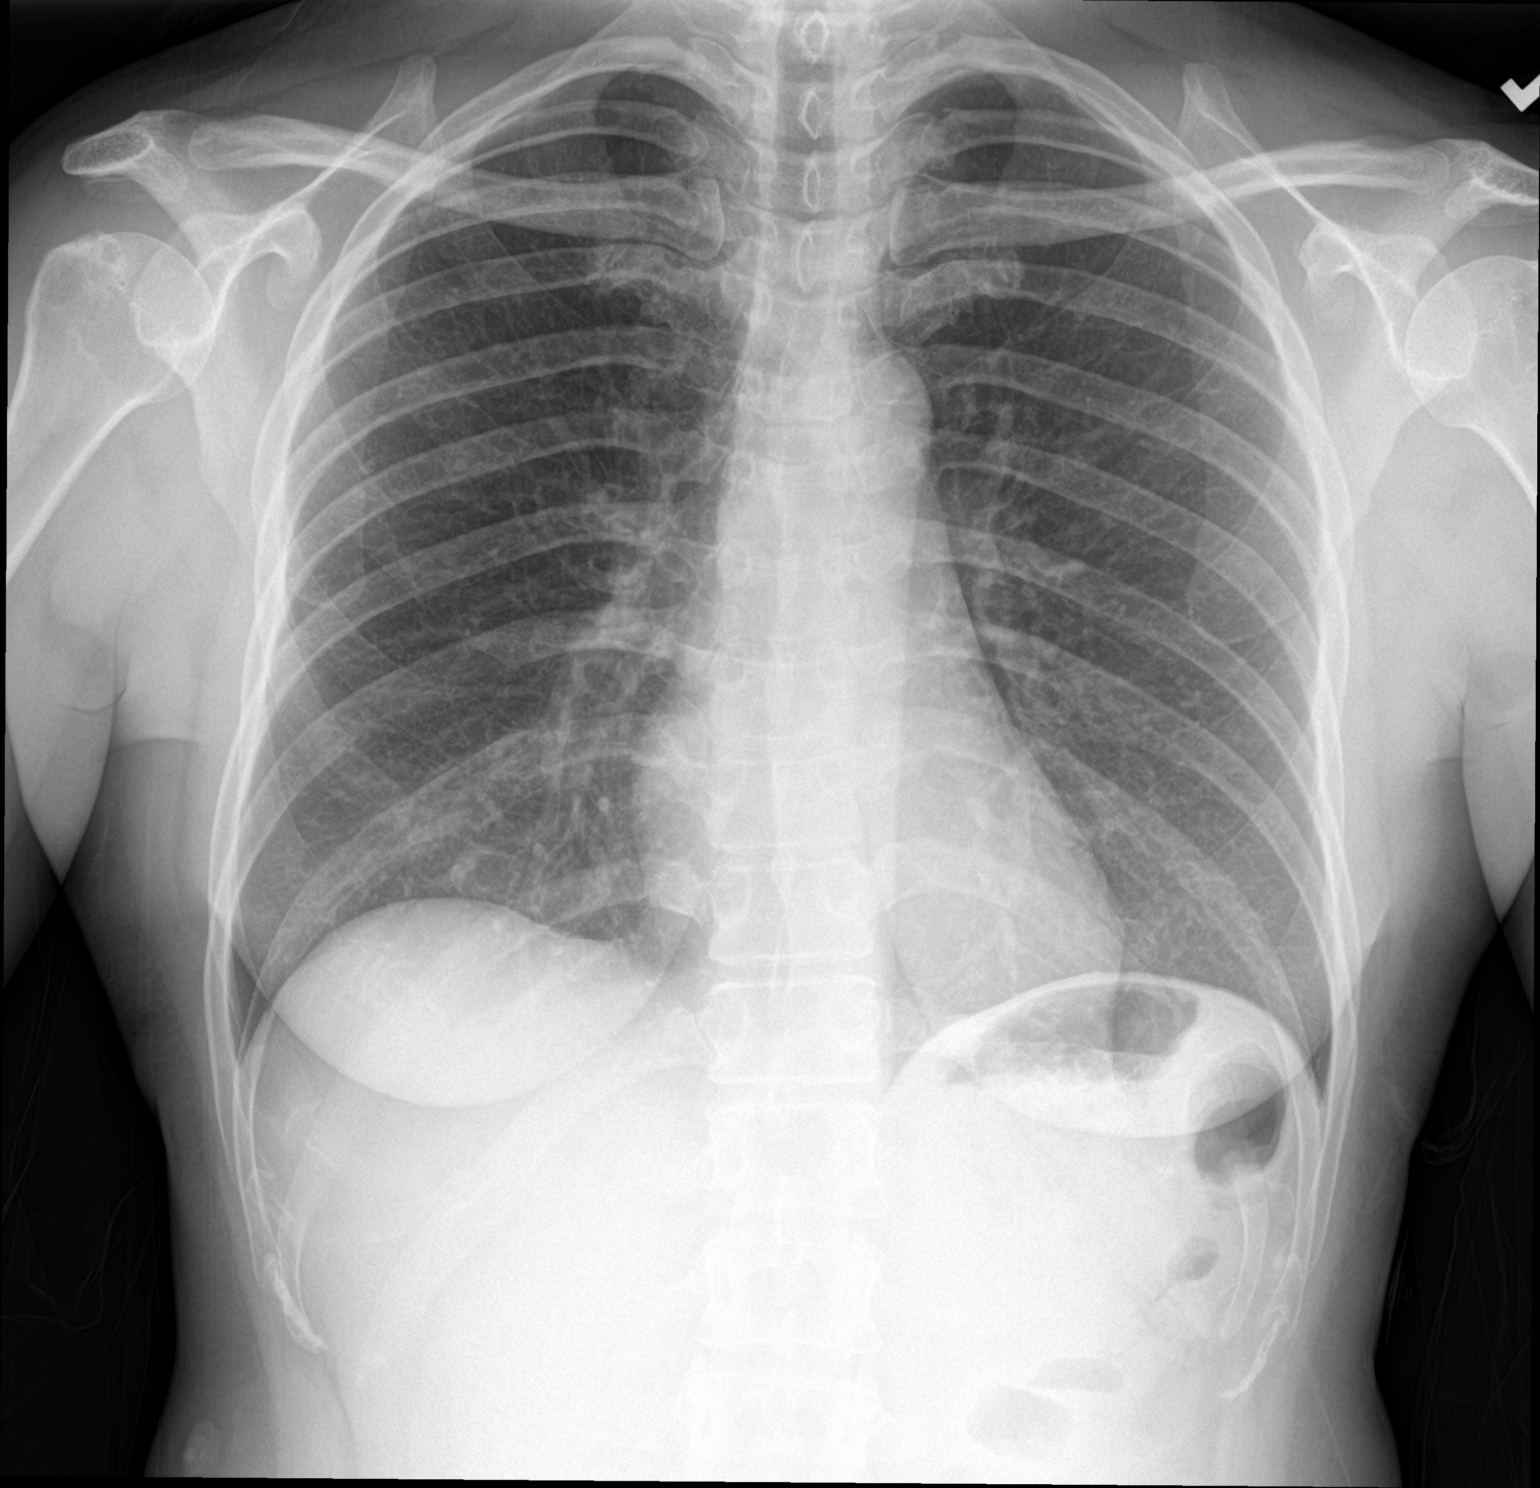

[2 of 2 positions shown; findings below may reference images not displayed]

FINDINGS: The heart size and mediastinal contours are within normal limits.
Both lungs are clear. The visualized skeletal structures are
unremarkable.
IMPRESSION: Negative.

## 2023-08-06 LAB — HM MAMMOGRAPHY

## 2023-08-09 ENCOUNTER — Encounter: Payer: Self-pay | Admitting: Nurse Practitioner

## 2023-08-13 ENCOUNTER — Encounter: Payer: Self-pay | Admitting: Obstetrics & Gynecology

## 2023-08-29 ENCOUNTER — Other Ambulatory Visit: Payer: Self-pay | Admitting: Nurse Practitioner

## 2023-08-29 DIAGNOSIS — I10 Essential (primary) hypertension: Secondary | ICD-10-CM

## 2023-09-11 ENCOUNTER — Encounter: Payer: Self-pay | Admitting: Nurse Practitioner

## 2023-09-11 ENCOUNTER — Ambulatory Visit (INDEPENDENT_AMBULATORY_CARE_PROVIDER_SITE_OTHER): Payer: Managed Care, Other (non HMO) | Admitting: Nurse Practitioner

## 2023-09-11 VITALS — BP 136/84 | HR 90 | Temp 98.1°F | Ht 63.0 in | Wt 144.0 lb

## 2023-09-11 DIAGNOSIS — Z0000 Encounter for general adult medical examination without abnormal findings: Secondary | ICD-10-CM | POA: Diagnosis not present

## 2023-09-11 DIAGNOSIS — Z122 Encounter for screening for malignant neoplasm of respiratory organs: Secondary | ICD-10-CM | POA: Diagnosis not present

## 2023-09-11 DIAGNOSIS — Z720 Tobacco use: Secondary | ICD-10-CM | POA: Diagnosis not present

## 2023-09-11 DIAGNOSIS — I10 Essential (primary) hypertension: Secondary | ICD-10-CM

## 2023-09-11 DIAGNOSIS — Z0001 Encounter for general adult medical examination with abnormal findings: Secondary | ICD-10-CM

## 2023-09-11 DIAGNOSIS — Z23 Encounter for immunization: Secondary | ICD-10-CM | POA: Diagnosis not present

## 2023-09-11 LAB — COMPREHENSIVE METABOLIC PANEL
ALT: 11 U/L (ref 0–35)
AST: 17 U/L (ref 0–37)
Albumin: 4.8 g/dL (ref 3.5–5.2)
Alkaline Phosphatase: 66 U/L (ref 39–117)
BUN: 6 mg/dL (ref 6–23)
CO2: 28 meq/L (ref 19–32)
Calcium: 9.6 mg/dL (ref 8.4–10.5)
Chloride: 103 meq/L (ref 96–112)
Creatinine, Ser: 0.72 mg/dL (ref 0.40–1.20)
GFR: 96.29 mL/min (ref >60.00)
Glucose, Bld: 83 mg/dL (ref 70–99)
Potassium: 3.9 meq/L (ref 3.5–5.1)
Sodium: 140 meq/L (ref 135–145)
Total Bilirubin: 0.4 mg/dL (ref 0.2–1.2)
Total Protein: 7.4 g/dL (ref 6.0–8.3)

## 2023-09-11 MED ORDER — OLMESARTAN MEDOXOMIL 20 MG PO TABS: 20.0000 mg | ORAL_TABLET | Freq: Every day | ORAL | 3 refills | Status: DC

## 2023-09-11 NOTE — Patient Instructions (Addendum)
 Go to lab Maintain Heart healthy diet and daily exercise. Maintain current medications. Monitor BP daily in AM Send BP reading via mychart in 1-2 weeks.  Preventive Care 21-52 Years Old, Female Preventive care refers to lifestyle choices and visits with your health care provider that can promote health and wellness. Preventive care visits are also called wellness exams. What can I expect for my preventive care visit? Counseling Your health care provider may ask you questions about your: Medical history, including: Past medical problems. Family medical history. Pregnancy history. Current health, including: Menstrual cycle. Method of birth control. Emotional well-being. Home life and relationship well-being. Sexual activity and sexual health. Lifestyle, including: Alcohol, nicotine or tobacco, and drug use. Access to firearms. Diet, exercise, and sleep habits. Work and work Astronomer. Sunscreen use. Safety issues such as seatbelt and bike helmet use. Physical exam Your health care provider will check your: Height and weight. These may be used to calculate your BMI (body mass index). BMI is a measurement that tells if you are at a healthy weight. Waist circumference. This measures the distance around your waistline. This measurement also tells if you are at a healthy weight and may help predict your risk of certain diseases, such as type 2 diabetes and high blood pressure. Heart rate and blood pressure. Body temperature. Skin for abnormal spots. What immunizations do I need?  Vaccines are usually given at various ages, according to a schedule. Your health care provider will recommend vaccines for you based on your age, medical history, and lifestyle or other factors, such as travel or where you work. What tests do I need? Screening Your health care provider may recommend screening tests for certain conditions. This may include: Lipid and cholesterol levels. Diabetes  screening. This is done by checking your blood sugar (glucose) after you have not eaten for a while (fasting). Pelvic exam and Pap test. Hepatitis B test. Hepatitis C test. HIV (human immunodeficiency virus) test. STI (sexually transmitted infection) testing, if you are at risk. Lung cancer screening. Colorectal cancer screening. Mammogram. Talk with your health care provider about when you should start having regular mammograms. This may depend on whether you have a family history of breast cancer. BRCA-related cancer screening. This may be done if you have a family history of breast, ovarian, tubal, or peritoneal cancers. Bone density scan. This is done to screen for osteoporosis. Talk with your health care provider about your test results, treatment options, and if necessary, the need for more tests. Follow these instructions at home: Eating and drinking  Eat a diet that includes fresh fruits and vegetables, whole grains, lean protein, and low-fat dairy products. Take vitamin and mineral supplements as recommended by your health care provider. Do not drink alcohol if: Your health care provider tells you not to drink. You are pregnant, may be pregnant, or are planning to become pregnant. If you drink alcohol: Limit how much you have to 0-1 drink a day. Know how much alcohol is in your drink. In the [place] one drink equals one 12 oz bottle of beer (355 mL), one 5 oz glass of wine (148 mL), or one 1 oz glass of hard liquor (44 mL). Lifestyle Brush your teeth every morning and night with fluoride toothpaste. Floss one time each day. Exercise for at least 30 minutes 5 or more days each week. Do not use any products that contain nicotine or tobacco. These products include cigarettes, chewing tobacco, and vaping devices, such as e-cigarettes. If you need help  quitting, ask your health care provider. Do not use drugs. If you are sexually active, practice safe sex. Use a condom or other form of  protection to prevent STIs. If you do not wish to become pregnant, use a form of birth control. If you plan to become pregnant, see your health care provider for a prepregnancy visit. Take aspirin only as told by your health care provider. Make sure that you understand how much to take and what form to take. Work with your health care provider to find out whether it is safe and beneficial for you to take aspirin daily. Find healthy ways to manage stress, such as: Meditation, yoga, or listening to music. Journaling. Talking to a trusted person. Spending time with friends and family. Minimize exposure to UV radiation to reduce your risk of skin cancer. Safety Always wear your seat belt while driving or riding in a vehicle. Do not drive: If you have been drinking alcohol. Do not ride with someone who has been drinking. When you are tired or distracted. While texting. If you have been using any mind-altering substances or drugs. Wear a helmet and other protective equipment during sports activities. If you have firearms in your house, make sure you follow all gun safety procedures. Seek help if you have been physically or sexually abused. What's next? Visit your health care provider once a year for an annual wellness visit. Ask your health care provider how often you should have your eyes and teeth checked. Stay up to date on all vaccines. This information is not intended to replace advice given to you by your health care provider. Make sure you discuss any questions you have with your health care provider. Document Revised: 12/01/2020 Document Reviewed: 12/01/2020 Elsevier Patient Education  2024 [employer].

## 2023-09-11 NOTE — Assessment & Plan Note (Addendum)
 Cut down to 1/2ppd x 75month 31pack years Quit during each pregnancies x4 -9months each time, quit again 02/2012-05/2013 Did not start chantix as prescribed in past No cough or SOB or chest pain or claudication Agreed to CT chest for lung cancer screen Encouraged to set quit date and use chantix as prescribed

## 2023-09-11 NOTE — Assessment & Plan Note (Signed)
 Reports elevated Bp with home BP machine: 140/80. compliant with benicar dose and DASH diet. BP Readings from Last 3 Encounters:  09/11/23 136/84  09/20/22 (!) 133/90  08/25/22 110/78    Repeat CMP Advised to Monitor BP daily in AM Send BP reading via mychart in 1-2 weeks. F/up in 3months

## 2023-09-11 NOTE — Progress Notes (Signed)
 Complete physical exam  Patient: Emily Baker   DOB: [birth year]   53 y.o. Female  MRN: [id number] Visit Date: 09/11/2023  Subjective:    Chief Complaint  Patient presents with   Annual Exam    Discuss BP medication    CAMBRYN Baker is a 53 y.o. female who presents today for a complete physical exam. She reports consuming a general diet.  Walking daily abd use of stepper under her desk  She generally feels well. She reports sleeping well. She does have additional problems to discuss today.  Vision:Yes Dental:No STD Screen:No  BP Readings from Last 3 Encounters:  09/11/23 136/84  09/20/22 (!) 133/90  08/25/22 110/78   Wt Readings from Last 3 Encounters:  09/11/23 144 lb (65.3 kg)  09/20/22 155 lb (70.3 kg)  08/25/22 154 lb 3.2 oz (69.9 kg)    Most recent fall risk assessment:    09/11/2023    9:34 AM  Fall Risk   Falls in the past year? 0  Number falls in past yr: 0  Injury with Fall? 0  Risk for fall due to : No Fall Risks  Follow up Falls evaluation completed     Depression screen:Yes - No Depression Most recent depression screenings:    09/11/2023    9:34 AM 09/20/2022    3:50 PM  PHQ 2/9 Scores  PHQ - 2 Score 0 0  PHQ- 9 Score 2 0    HPI  Tobacco abuse disorder Cut down to 1/2ppd x 84month 31pack years Quit during each pregnancies x4 -9months each time, quit again 02/2012-05/2013 Did not start chantix as prescribed in past No cough or SOB or chest pain or claudication Agreed to CT chest for lung cancer screen Encouraged to set quit date and use chantix as prescribed  Benign essential HTN Reports elevated Bp with home BP machine: 140/80. compliant with benicar dose and DASH diet. BP Readings from Last 3 Encounters:  09/11/23 136/84  09/20/22 (!) 133/90  08/25/22 110/78    Repeat CMP Advised to Monitor BP daily in AM Send BP reading via mychart in 1-2 weeks. F/up in 3months   Past Medical History:  Diagnosis Date   Arthritis    feet  and hands   Family history of ovarian cancer    Patient's mother   Hypertension    Pyogenic arthritis of right hand (HCC) 01/12/2022   Past Surgical History:  Procedure Laterality Date   BREAST BIOPSY Right    BREAST EXCISIONAL BIOPSY Right 2019   CYST EXCISION Right 06/21/2022   Procedure: Right middle finger digital mucous cyst excision with osteophyte debridement;  Surgeon: Marlyne Beards, MD;  Location: [facility];  Service: Orthopedics;  Laterality: Right;  60 min local and MAC   ENDOMETRIAL ABLATION  11/2007   FOOT SURGERY Bilateral    LAPAROSCOPY     X 3- Hx endometriosis per patient   TUBAL LIGATION     Social History   Socioeconomic History   Marital status: Married    Spouse name: Tyrone   Number of children: 4   Years of education: Not on file   Highest education level: Master's degree (e.g., MA, MS, MEng, MEd, MSW, MBA)  Occupational History   Occupation: Designer, jewellery  Tobacco Use   Smoking status: Every Day    Current packs/day: 0.50    Average packs/day: 0.5 packs/day for 44.0 years (22.0 ttl pk-yrs)    Types: Cigarettes   Smokeless tobacco: Never  Vaping Use   Vaping status: Never Used  Substance and Sexual Activity   Alcohol use: Yes    Alcohol/week: 0.0 standard drinks of alcohol    Comment: occasional   Drug use: No   Sexual activity: Yes    Partners: Male    Birth control/protection: Surgical    Comment: uterine ablation  Other Topics Concern   Not on file  Social History Narrative   Not on file   Social Drivers of Health   Financial Resource Strain: Low Risk  (09/10/2023)   Overall Financial Resource Strain (CARDIA)    Difficulty of Paying Living Expenses: Not hard at all  Food Insecurity: No Food Insecurity (09/10/2023)   Hunger Vital Sign    Worried About Running Out of Food in the Last Year: Never true    Ran Out of Food in the Last Year: Never true  Transportation Needs: No Transportation Needs (09/10/2023)   PRAPARE  - Administrator, Civil Service (Medical): No    Lack of Transportation (Non-Medical): No  Physical Activity: Sufficiently Active (09/10/2023)   Exercise Vital Sign    Days of Exercise per Week: 4 days    Minutes of Exercise per Session: 60 min  Stress: No Stress Concern Present (09/10/2023)   [employer] of Occupational Health - Occupational Stress Questionnaire    Feeling of Stress : Only a little  Social Connections: Moderately Integrated (09/10/2023)   Social Connection and Isolation Panel [NHANES]    Frequency of Communication with Friends and Family: More than three times a week    Frequency of Social Gatherings with Friends and Family: Once a week    Attends Religious Services: More than 4 times per year    Active Member of [employer] or Organizations: No    Attends Engineer, structural: Not on file    Marital Status: Married  Catering manager Violence: Not on file   Family Status  Relation Name Status   Mother  Alive   Father  Alive   Brother  Alive   Brother  Alive   MGF  Deceased   PGM  Deceased   PGF  Deceased   Daughter  Estate manager/land agent   Daughter  Alive   Daughter  Alive   Child  Deceased   Neg Hx  (Not Specified)  No partnership data on file   Family History  Problem Relation Age of Onset   Colon cancer Mother    Ovarian cancer Mother    Hypertension Mother    Cancer Mother        Ovarian cancer   Hyperlipidemia Brother    Hypertension Brother    Hyperlipidemia Brother    Hypertension Maternal Grandfather    Colon cancer Maternal Grandfather 60   Diabetes Mellitus II Paternal Grandmother    Healthy Child        x 4   Esophageal cancer Neg Hx    Stomach cancer Neg Hx    Rectal cancer Neg Hx    No Known Allergies  Patient Care Team: Gregori Abril, Bonna Gains, NP as PCP - General (Internal Medicine)   Medications: Outpatient Medications Prior to Visit  Medication Sig   [DISCONTINUED] olmesartan (BENICAR) 20 MG tablet Take 1  tablet (20 mg total) by mouth daily.   [DISCONTINUED] diclofenac (VOLTAREN) 75 MG EC tablet TAKE 1 TABLET BY MOUTH TWICE A DAY (Patient not taking: Reported on 09/20/2022)   [DISCONTINUED] fluticasone (FLONASE) 50 MCG/ACT nasal spray  Place 2 sprays into both nostrils daily.   [DISCONTINUED] naproxen (NAPROSYN) 500 MG tablet Take 1 tablet (500 mg total) by mouth 2 (two) times daily with a meal.   [DISCONTINUED] PARoxetine (PAXIL) 10 MG tablet Take 1 tablet (10 mg total) by mouth daily.   [DISCONTINUED] varenicline (CHANTIX) 0.5 MG tablet 1tab daily x 3days, then 1tab BID x 4days, then 2tabs BID continuously. Take with food (Patient not taking: Reported on 09/20/2022)   No facility-administered medications prior to visit.    Review of Systems  Constitutional:  Negative for activity change, appetite change and unexpected weight change.  Respiratory: Negative.    Cardiovascular: Negative.   Gastrointestinal: Negative.   Endocrine: Negative for cold intolerance and heat intolerance.  Genitourinary: Negative.   Musculoskeletal: Negative.   Skin: Negative.   Neurological: Negative.   Hematological: Negative.   Psychiatric/Behavioral:  Negative for behavioral problems, decreased concentration, dysphoric mood, hallucinations, self-injury, sleep disturbance and suicidal ideas. The patient is not nervous/anxious.         Objective:  BP 136/84 (BP Location: Left Arm, Patient Position: Sitting, Cuff Size: Normal)   Pulse 90   Temp 98.1 F (36.7 C) (Temporal)   Ht 5\' 3"  (1.6 m)   Wt 144 lb (65.3 kg)   SpO2 100%   BMI 25.51 kg/m     Physical Exam Vitals and nursing note reviewed.  Constitutional:      General: She is not in acute distress. HENT:     Right Ear: Tympanic membrane, ear canal and external ear normal.     Left Ear: Tympanic membrane, ear canal and external ear normal.     Nose: Nose normal.  Eyes:     Extraocular Movements: Extraocular movements intact.      Conjunctiva/sclera: Conjunctivae normal.     Pupils: Pupils are equal, round, and reactive to light.  Neck:     Thyroid: No thyroid mass, thyromegaly or thyroid tenderness.  Cardiovascular:     Rate and Rhythm: Normal rate and regular rhythm.     Pulses: Normal pulses.     Heart sounds: Normal heart sounds.  Pulmonary:     Effort: Pulmonary effort is normal.     Breath sounds: Normal breath sounds.  Abdominal:     General: Bowel sounds are normal.     Palpations: Abdomen is soft.  Musculoskeletal:        General: Normal range of motion.     Cervical back: Normal range of motion and neck supple.     Right lower leg: No edema.     Left lower leg: No edema.  Lymphadenopathy:     Cervical: No cervical adenopathy.  Skin:    General: Skin is warm and dry.  Neurological:     Mental Status: She is alert and oriented to person, place, and time.     Cranial Nerves: No cranial nerve deficit.  Psychiatric:        Mood and Affect: Mood normal.        Behavior: Behavior normal.        Thought Content: Thought content normal.      No results found for any visits on 09/11/23.    Assessment & Plan:    Routine Health Maintenance and Physical Exam  Immunization History  Administered Date(s) Administered   Influenza,inj,Quad PF,6+ Mos 03/26/2018, 05/17/2021, 06/05/2022   Influenza,inj,Quad PF,6-35 Mos 03/26/2018   Influenza-Unspecified 08/16/2017   Pneumococcal Polysaccharide-23 10/22/2015   Tdap 11/17/2005, 10/18/2015, 10/22/2015   Zoster Recombinant(Shingrix)  08/25/2022, 09/11/2023    Health Maintenance  Topic Date Due   Lung Cancer Screening  Never done   INFLUENZA VACCINE  09/17/2023 (Originally 01/18/2023)   COVID-19 Vaccine (1 - 2024-25 season) 09/27/2023 (Originally 02/18/2023)   MAMMOGRAM  08/05/2025   DTaP/Tdap/Td (4 - Td or Tdap) 10/21/2025   Colonoscopy  02/16/2026   Cervical Cancer Screening (HPV/Pap Cotest)  09/20/2027   Hepatitis C Screening  Completed   HIV Screening   Completed   Zoster Vaccines- Shingrix  Completed   HPV VACCINES  Aged Out   Pneumococcal Vaccine 63-71 Years old  Discontinued    Discussed health benefits of physical activity, and encouraged her to engage in regular exercise appropriate for her age and condition.  Problem List Items Addressed This Visit     Benign essential HTN   Reports elevated Bp with home BP machine: 140/80. compliant with benicar dose and DASH diet. BP Readings from Last 3 Encounters:  09/11/23 136/84  09/20/22 (!) 133/90  08/25/22 110/78    Repeat CMP Advised to Monitor BP daily in AM Send BP reading via mychart in 1-2 weeks. F/up in 3months      Relevant Medications   olmesartan (BENICAR) 20 MG tablet   Tobacco abuse disorder   Cut down to 1/2ppd x 77month 31pack years Quit during each pregnancies x4 -9months each time, quit again 02/2012-05/2013 Did not start chantix as prescribed in past No cough or SOB or chest pain or claudication Agreed to CT chest for lung cancer screen Encouraged to set quit date and use chantix as prescribed      Relevant Orders   Low Dose CT Chest w/o Contrast for Lung Cancer Screening [ZOX0960]   Other Visit Diagnoses       Encounter for preventative adult health care exam with abnormal findings    -  Primary   Relevant Orders   Comprehensive metabolic panel     Need for shingles vaccine       Relevant Orders   Zoster, Recombinant (Shingrix) (Completed)     Encounter for screening for lung cancer       Relevant Orders   Low Dose CT Chest w/o Contrast for Lung Cancer Screening [AVW0981]     Essential (primary) hypertension       Relevant Medications   olmesartan (BENICAR) 20 MG tablet      Return in about 3 months (around 12/12/2023) for HTN.     Alysia Penna, NP

## 2023-09-14 ENCOUNTER — Telehealth: Admitting: Physician Assistant

## 2023-09-14 ENCOUNTER — Telehealth: Admitting: Nurse Practitioner

## 2023-09-14 DIAGNOSIS — Z20818 Contact with and (suspected) exposure to other bacterial communicable diseases: Secondary | ICD-10-CM | POA: Diagnosis not present

## 2023-09-14 DIAGNOSIS — J069 Acute upper respiratory infection, unspecified: Secondary | ICD-10-CM

## 2023-09-14 MED ORDER — AZITHROMYCIN 250 MG PO TABS: ORAL_TABLET | ORAL | 0 refills | Status: AC

## 2023-09-14 NOTE — Progress Notes (Signed)
 Virtual Visit Consent   Emily Baker, you are scheduled for a virtual visit with a [facility] provider today. Just as with appointments in the office, your consent must be obtained to participate. Your consent will be active for this visit and any virtual visit you may have with one of our providers in the next 365 days. If you have a MyChart account, a copy of this consent can be sent to you electronically.  As this is a virtual visit, video technology does not allow for your provider to perform a traditional examination. This may limit your provider's ability to fully assess your condition. If your provider identifies any concerns that need to be evaluated in person or the need to arrange testing (such as labs, EKG, etc.), we will make arrangements to do so. Although advances in technology are sophisticated, we cannot ensure that it will always work on either your end or our end. If the connection with a video visit is poor, the visit may have to be switched to a telephone visit. With either a video or telephone visit, we are not always able to ensure that we have a secure connection.  By engaging in this virtual visit, you consent to the provision of healthcare and authorize for your insurance to be billed (if applicable) for the services provided during this visit. Depending on your insurance coverage, you may receive a charge related to this service.  I need to obtain your verbal consent now. Are you willing to proceed with your visit today? Emily Baker has provided verbal consent on 09/14/2023 for a virtual visit (video or telephone). Viviano Simas, FNP  Date: 09/14/2023 5:11 PM   Virtual Visit via Video Note   I, Viviano Simas, connected with  Emily Baker  ([id number], January 23, 1971) on 09/14/23 at  5:15 PM EDT by a video-enabled telemedicine application and verified that I am speaking with the correct person using two identifiers.  Location: Patient: Virtual Visit Location  Patient: Home Provider: Virtual Visit Location Provider: Home Office   I discussed the limitations of evaluation and management by telemedicine and the availability of in person appointments. The patient expressed understanding and agreed to proceed.    History of Present Illness: Emily Baker is a 53 y.o. who identifies as a female who was assigned female at birth, and is being seen today for a cough that she has been experiencing   Had a physical 3 days ago and she received her second shingles vaccine  The following day she started to have a low grade fever and some body aches   She was then exposed to her grandchildren who were known to have whooping cough   She did have a Tdap in 2017    Her symptoms today are a mixture of a dry cough that might be productive at times   Denies a history of asthma or need for inhalers in the past   She has been using Mucinex for relief    Problems:  Patient Active Problem List   Diagnosis Date Noted   Digital mucous cyst of right hand 01/13/2022   Heberden's nodes of right hand 09/01/2021   Vitamin D insufficiency 11/09/2020   Vitamin B12 deficiency 11/09/2020   Paresthesia 10/15/2019   Genital herpes 08/14/2018   Microcalcification of right breast on mammogram 07/23/2017   Low back pain, unspecified 06/07/2015   CFS (chronic fatigue syndrome) 11/04/2014   Tobacco abuse disorder 11/04/2014   Benign essential HTN 11/18/2013  Allergies: No Known Allergies Medications:  Current Outpatient Medications:    olmesartan (BENICAR) 20 MG tablet, Take 1 tablet (20 mg total) by mouth daily., Disp: 90 tablet, Rfl: 3  Observations/Objective: Patient is well-developed, well-nourished in no acute distress.  Resting comfortably  at home.  Head is normocephalic, atraumatic.  No labored breathing.  Speech is clear and coherent with logical content.  Patient is alert and oriented at baseline.    Assessment and Plan:  1. Exposure to  pertussis  May continue to use Mucinex for added relief   Follow up with new/worsening or persistent symptoms   Meds ordered this encounter  Medications   azithromycin (ZITHROMAX) 250 MG tablet    Sig: Take 2 tablets on day 1, then 1 tablet daily on days 2 through 5    Dispense:  6 tablet    Refill:  0     Follow Up Instructions: I discussed the assessment and treatment plan with the patient. The patient was provided an opportunity to ask questions and all were answered. The patient agreed with the plan and demonstrated an understanding of the instructions.  A copy of instructions were sent to the patient via MyChart unless otherwise noted below.    The patient was advised to call back or seek an in-person evaluation if the symptoms worsen or if the condition fails to improve as anticipated.    Viviano Simas, FNP

## 2023-09-14 NOTE — Progress Notes (Signed)
   Thank you for the details you included in the comment boxes. Those details are very helpful in determining the best course of treatment for you and help [place] to provide the best care.Because of exposure to whooping cough, we recommend that you schedule a Virtual Urgent Care video visit in order for the provider to better assess what is going on.  The provider will be able to give you a more accurate diagnosis and treatment plan if we can more freely discuss your symptoms and with the addition of a virtual examination.   If you change your visit to a video visit, we will bill your insurance (similar to an office visit) and you will not be charged for this e-Visit. You will be able to stay at home and speak with the first available [facility] advanced practice provider. The link to do a video visit is in the drop down Menu tab of your Welcome screen in MyChart.

## 2023-09-20 ENCOUNTER — Telehealth (HOSPITAL_BASED_OUTPATIENT_CLINIC_OR_DEPARTMENT_OTHER): Payer: Self-pay

## 2023-09-23 ENCOUNTER — Ambulatory Visit (HOSPITAL_BASED_OUTPATIENT_CLINIC_OR_DEPARTMENT_OTHER)

## 2023-09-26 ENCOUNTER — Other Ambulatory Visit: Payer: Self-pay | Admitting: Nurse Practitioner

## 2023-09-26 ENCOUNTER — Ambulatory Visit: Payer: Managed Care, Other (non HMO) | Admitting: Obstetrics & Gynecology

## 2023-10-08 ENCOUNTER — Telehealth: Payer: Self-pay | Admitting: Nurse Practitioner

## 2023-10-08 ENCOUNTER — Encounter: Payer: Self-pay | Admitting: Nurse Practitioner

## 2023-10-08 DIAGNOSIS — I10 Essential (primary) hypertension: Secondary | ICD-10-CM

## 2023-10-08 NOTE — Telephone Encounter (Signed)
 Hello Emily Baker,   After further review with this order. IT was denied due to the type of exam, not Contrast vs no contrast. The code is that the portal required to use is the main code the use to auth the test.  The Peer to peer can be done up until 03/12/24 To request a review by phone, call: [phone] ----- Message -----

## 2023-10-09 MED ORDER — OLMESARTAN MEDOXOMIL 40 MG PO TABS: 40.0000 mg | ORAL_TABLET | Freq: Every day | ORAL | 3 refills | Status: DC

## 2023-10-17 ENCOUNTER — Ambulatory Visit: Admitting: Obstetrics & Gynecology

## 2023-11-02 ENCOUNTER — Other Ambulatory Visit: Payer: Self-pay | Admitting: Medical Genetics

## 2023-11-06 ENCOUNTER — Other Ambulatory Visit (HOSPITAL_COMMUNITY)

## 2023-11-09 ENCOUNTER — Ambulatory Visit (INDEPENDENT_AMBULATORY_CARE_PROVIDER_SITE_OTHER): Admitting: Nurse Practitioner

## 2023-11-09 ENCOUNTER — Encounter: Payer: Self-pay | Admitting: Nurse Practitioner

## 2023-11-09 VITALS — BP 130/88 | HR 69 | Temp 97.7°F | Ht 63.0 in | Wt 148.2 lb

## 2023-11-09 DIAGNOSIS — Z720 Tobacco use: Secondary | ICD-10-CM | POA: Diagnosis not present

## 2023-11-09 DIAGNOSIS — I10 Essential (primary) hypertension: Secondary | ICD-10-CM | POA: Diagnosis not present

## 2023-11-09 NOTE — Assessment & Plan Note (Addendum)
 Current tobacco use 0.75ppd since age 53 Tobacco use:61yrs - 63yrs (breaks with 3 pregnancies)  Correct pack years: 25pack years.  CT chest-low dose lung cancer screen not covered by insurance. Encouraged to quit tobacco use

## 2023-11-09 NOTE — Assessment & Plan Note (Signed)
 BP at goal with olmesartan  40mg  BP Readings from Last 3 Encounters:  11/09/23 130/88  09/11/23 136/84  09/20/22 (!) 133/90    Advised to quit tobacco use Maintain med dose F/up in 3-29months

## 2023-11-09 NOTE — Progress Notes (Signed)
                Established Patient Visit  Patient: Emily Baker   DOB: October 16, 1970   53 y.o. Female  MRN: [id number] Visit Date: 11/09/2023  Subjective:     Chief Complaint  Patient presents with   Follow-up    Follow up for blood pressure says she has no concerns    HPI Tobacco abuse disorder Current tobacco use 0.75ppd since age 66 Tobacco use:4yrs - 45yrs (breaks with 3 pregnancies)  Correct pack years: 25pack years.  CT chest-low dose lung cancer screen not covered by insurance. Encouraged to quit tobacco use  Benign essential HTN BP at goal with olmesartan  40mg  BP Readings from Last 3 Encounters:  11/09/23 130/88  09/11/23 136/84  09/20/22 (!) 133/90    Advised to quit tobacco use Maintain med dose F/up in 3-15months  BP Readings from Last 3 Encounters:  11/09/23 130/88  09/11/23 136/84  09/20/22 (!) 133/90    Wt Readings from Last 3 Encounters:  11/09/23 148 lb 3.2 oz (67.2 kg)  09/11/23 144 lb (65.3 kg)  09/20/22 155 lb (70.3 kg)    Reviewed medical, surgical, and social history today  Medications: Outpatient Medications Prior to Visit  Medication Sig   olmesartan  (BENICAR ) 40 MG tablet Take 1 tablet (40 mg total) by mouth daily.   No facility-administered medications prior to visit.   Reviewed past medical and social history.   ROS per HPI above      Objective:  BP 130/88 (BP Location: Left Arm, Patient Position: Sitting, Cuff Size: Small)   Pulse 69   Temp 97.7 F (36.5 C) (Temporal)   Ht 5\' 3"  (1.6 m)   Wt 148 lb 3.2 oz (67.2 kg)   SpO2 100%   BMI 26.25 kg/m      Physical Exam Cardiovascular:     Rate and Rhythm: Normal rate and regular rhythm.     Pulses: Normal pulses.     Heart sounds: Normal heart sounds.  Pulmonary:     Effort: Pulmonary effort is normal.     Breath sounds: Normal breath sounds.  Musculoskeletal:     Right lower leg: No edema.     Left lower leg: No edema.  Neurological:     Mental Status: She is  alert and oriented to person, place, and time.     No results found for any visits on 11/09/23.    Assessment & Plan:    Problem List Items Addressed This Visit     Benign essential HTN - Primary   BP at goal with olmesartan  40mg  BP Readings from Last 3 Encounters:  11/09/23 130/88  09/11/23 136/84  09/20/22 (!) 133/90    Advised to quit tobacco use Maintain med dose F/up in 3-32months      Tobacco abuse disorder   Current tobacco use 0.75ppd since age 35 Tobacco use:50yrs - 35yrs (breaks with 3 pregnancies)  Correct pack years: 25pack years.  CT chest-low dose lung cancer screen not covered by insurance. Encouraged to quit tobacco use      Return in about 6 months (around 05/11/2024) for HTN.     Kathrene Parents, NP

## 2023-11-28 ENCOUNTER — Other Ambulatory Visit (HOSPITAL_BASED_OUTPATIENT_CLINIC_OR_DEPARTMENT_OTHER): Payer: Self-pay

## 2023-11-29 ENCOUNTER — Telehealth: Payer: Self-pay | Admitting: Nurse Practitioner

## 2023-11-29 DIAGNOSIS — I10 Essential (primary) hypertension: Secondary | ICD-10-CM

## 2023-11-29 NOTE — Telephone Encounter (Signed)
 Prescription Request  11/29/2023  LOV: 11/09/2023  What is the name of the medication or equipment? olmesartan  (BENICAR ) 40 MG tablet [[id number]]   Have you contacted your pharmacy to request a refill? No, we sent it to  [facility] on 4/22/5, she wants it resent to [facility]  Which pharmacy would you like this sent to?  [facility] [id number] - [place], [place] - [address] [place] [address] Phone: [phone] Fax: [phone]      Patient notified that their request is being sent to the clinical staff for review and that they should receive a response within 2 business days.   Please advise at [facility] [phone]

## 2023-11-30 MED ORDER — OLMESARTAN MEDOXOMIL 40 MG PO TABS: 40.0000 mg | ORAL_TABLET | Freq: Every day | ORAL | 1 refills | Status: DC

## 2023-11-30 NOTE — Telephone Encounter (Signed)
 Called patient to ask if she ever received any of the medication refills from [facility]. She stated no that she doubled up on the 20 mg until she ran out and that she has not used that mail pharmacy in a long time. She stated that her insurance has them changing their pharmacy every so often. I asked her to confirm the dose amount of Olmesartan  (Benicar ) she stated that it's 40 mg daily. I informed her that I will send the Rx to the [facility] in [place] on [address] today. She thanked me for calling her back and confirming the dose amount.

## 2023-12-13 ENCOUNTER — Ambulatory Visit: Admitting: Nurse Practitioner

## 2024-01-13 ENCOUNTER — Ambulatory Visit (HOSPITAL_BASED_OUTPATIENT_CLINIC_OR_DEPARTMENT_OTHER)
Admission: RE | Admit: 2024-01-13 | Discharge: 2024-01-13 | Disposition: A | Discharge status: Home / Self Care | Payer: Self-pay | Source: Ambulatory Visit | Attending: Nurse Practitioner | Admitting: Nurse Practitioner

## 2024-01-13 DIAGNOSIS — Z122 Encounter for screening for malignant neoplasm of respiratory organs: Secondary | ICD-10-CM | POA: Insufficient documentation

## 2024-01-13 DIAGNOSIS — Z720 Tobacco use: Secondary | ICD-10-CM | POA: Insufficient documentation

## 2024-01-21 ENCOUNTER — Ambulatory Visit: Payer: Self-pay | Admitting: Nurse Practitioner

## 2024-01-22 ENCOUNTER — Encounter: Payer: Self-pay | Admitting: Nurse Practitioner

## 2024-01-22 ENCOUNTER — Ambulatory Visit (INDEPENDENT_AMBULATORY_CARE_PROVIDER_SITE_OTHER): Payer: Self-pay | Admitting: Nurse Practitioner

## 2024-01-22 VITALS — BP 122/70 | HR 73 | Temp 98.7°F | Ht 63.0 in | Wt 145.2 lb

## 2024-01-22 DIAGNOSIS — Z720 Tobacco use: Secondary | ICD-10-CM

## 2024-01-22 MED ORDER — BUPROPION HCL ER (SR) 100 MG PO TB12: ORAL_TABLET | ORAL | 5 refills | Status: AC

## 2024-01-22 NOTE — Progress Notes (Signed)
                Established Patient Visit  Patient: Emily Baker   DOB: 1970/12/13   53 y.o. Female  MRN: [id number] Visit Date: 01/22/2024  Subjective:    Chief Complaint  Patient presents with   CT results     Discuss CT results and the next steps    HPI Accompanied by husband. Tobacco abuse disorder Quit tobacco use on 01/21/2024 She agreed to start zyban  100mg  BID We reviewed CT chest results. She denies any cough or SOB. F/up in 3months  Reviewed medical, surgical, and social history today  Medications: Outpatient Medications Prior to Visit  Medication Sig   olmesartan  (BENICAR ) 40 MG tablet Take 1 tablet (40 mg total) by mouth daily.   No facility-administered medications prior to visit.   Reviewed past medical and social history.   ROS per HPI above      Objective:  BP 122/70 (BP Location: Left Arm, Patient Position: Sitting, Cuff Size: Normal)   Pulse 73   Temp 98.7 F (37.1 C) (Oral)   Ht 5' 3 (1.6 m)   Wt 145 lb 3.2 oz (65.9 kg)   SpO2 99%   BMI 25.72 kg/m      Physical Exam Vitals and nursing note reviewed.  Cardiovascular:     Rate and Rhythm: Normal rate.     Pulses: Normal pulses.  Pulmonary:     Effort: Pulmonary effort is normal.  Neurological:     Mental Status: She is alert and oriented to person, place, and time.     No results found for any visits on 01/22/24.    Assessment & Plan:    Problem List Items Addressed This Visit     Tobacco abuse disorder - Primary   Quit tobacco use on 01/21/2024 She agreed to start zyban  100mg  BID We reviewed CT chest results. She denies any cough or SOB. F/up in 3months      Relevant Medications   buPROPion  ER (WELLBUTRIN  SR) 100 MG 12 hr tablet   Return in about 3 months (around 04/23/2024) for tobacco use cessation.     Roselie Mood, NP

## 2024-01-22 NOTE — Assessment & Plan Note (Signed)
 Quit tobacco use on 01/21/2024 She agreed to start zyban  100mg  BID We reviewed CT chest results. She denies any cough or SOB. F/up in 3months

## 2024-01-22 NOTE — Patient Instructions (Signed)
 How to Cope With Quitting Smoking Learn tips to help you cope with the challenges that come with quitting smoking. To view the content, go to this web address: https://[email or web address].[email or web address]  This video will expire on: 05/30/2025. If you need access to this video following this date, please reach out to the healthcare provider who assigned it to you. This information is not intended to replace advice given to you by your health care provider. Make sure you discuss any questions you have with your health care provider. Elsevier Patient Education  2024 [employer].

## 2024-04-10 ENCOUNTER — Other Ambulatory Visit: Payer: Self-pay | Admitting: Medical Genetics

## 2024-04-10 DIAGNOSIS — Z006 Encounter for examination for normal comparison and control in clinical research program: Secondary | ICD-10-CM

## 2024-04-25 ENCOUNTER — Ambulatory Visit: Admitting: Nurse Practitioner

## 2024-05-12 ENCOUNTER — Ambulatory Visit: Admitting: Nurse Practitioner

## 2024-05-28 ENCOUNTER — Ambulatory Visit (INDEPENDENT_AMBULATORY_CARE_PROVIDER_SITE_OTHER): Admitting: Obstetrics and Gynecology

## 2024-05-28 VITALS — BP 130/73 | HR 87 | Ht 63.0 in | Wt 142.0 lb

## 2024-05-28 DIAGNOSIS — Z1231 Encounter for screening mammogram for malignant neoplasm of breast: Secondary | ICD-10-CM

## 2024-05-28 DIAGNOSIS — Z01419 Encounter for gynecological examination (general) (routine) without abnormal findings: Secondary | ICD-10-CM

## 2024-05-28 DIAGNOSIS — N898 Other specified noninflammatory disorders of vagina: Secondary | ICD-10-CM | POA: Diagnosis not present

## 2024-05-28 DIAGNOSIS — N951 Menopausal and female climacteric states: Secondary | ICD-10-CM | POA: Diagnosis not present

## 2024-05-28 NOTE — Progress Notes (Signed)
 ANNUAL GYNECOLOGY VISIT Chief Complaint  Patient presents with   Gynecologic Exam     Subjective:  Emily Baker is a 53 y.o. G4P4000 who presents for annual.  Reports bothersome hot flashes and night sweats. Can't sleep in the same bed as her husband due to the severity of symptoms. Up at night 4-5 times a night. Drenched in sweat. Also has vaginal dryness. Doesn't want to have sex.   Last pap:  Lab Results  Component Value Date   DIAGPAP  09/20/2022    - Negative for intraepithelial lesion or malignancy (NILM)   HPV NOT DETECTED 08/14/2018   HPVHIGH Negative 09/20/2022  History of abnormal pap: No Last mammogram: 07/2023 Last colonoscopy: 2022     The pregnancy intention screening data noted above was reviewed. Potential methods of contraception were discussed. The patient elected to proceed with No data recorded.       05/28/2024    3:24 PM 09/11/2023    9:34 AM 09/20/2022    3:50 PM 08/25/2022    1:02 PM 06/05/2022    8:33 AM  Depression screen PHQ 2/9  Decreased Interest 0 0 0 0 0  Down, Depressed, Hopeless 0 0 0 0 0  PHQ - 2 Score 0 0 0 0 0  Altered sleeping 1 2 0  1  Tired, decreased energy 0 0 0  0  Change in appetite 0 0 0  0  Feeling bad or failure about yourself  0 0 0  0  Trouble concentrating 0 0 0  0  Moving slowly or fidgety/restless 0 0 0  0  Suicidal thoughts 0 0 0  0  PHQ-9 Score 1 2  0   1   Difficult doing work/chores  Not difficult at all   Not difficult at all     Data saved with a previous flowsheet row definition        05/28/2024    3:24 PM 09/11/2023    9:34 AM 09/20/2022    3:51 PM 06/05/2022    8:33 AM  GAD 7 : Generalized Anxiety Score  Nervous, Anxious, on Edge 0 0 0 0  Control/stop worrying 0 0 0 0  Worry too much - different things 0 0 0 0  Trouble relaxing 0 0 0 0  Restless 0 0 0 0  Easily annoyed or irritable 0 0 0 0  Afraid - awful might happen 0 0 0 0  Total GAD 7 Score 0 0 0 0  Anxiety Difficulty  Not difficult  at all  Not difficult at all      OB History     Gravida  4   Para  4   Term  4   Preterm      AB      Living         SAB      IAB      Ectopic      Multiple      Live Births              Past Medical History:  Diagnosis Date   Arthritis    feet and hands   Family history of ovarian cancer    Patient's mother   [place]ber[place]'s nodes of right hand 09/01/2021   Hypertension    Pyogenic arthritis of right hand (HCC) 01/12/2022    Past Surgical History:  Procedure Laterality Date   BREAST BIOPSY Right    BREAST EXCISIONAL BIOPSY Right 2019  CYST EXCISION Right 06/21/2022   Procedure: Right middle finger digital mucous cyst excision with osteophyte debridement;  Surgeon: Romona Harari, MD;  Location: [facility];  Service: Orthopedics;  Laterality: Right;  60 min local and MAC   ENDOMETRIAL ABLATION  11/2007   FOOT SURGERY Bilateral    LAPAROSCOPY     X 3- Hx endometriosis per patient   TUBAL LIGATION      Social History   Socioeconomic History   Marital status: Married    Spouse name: Tyrone   Number of children: 4   Years of education: Not on file   Highest education level: Master's degree (e.g., MA, MS, MEng, MEd, MSW, MBA)  Occupational History   Occupation: Designer, Jewellery  Tobacco Use   Smoking status: Former    Current packs/day: 0.00    Average packs/day: 0.8 packs/day for 36.6 years (27.4 ttl pk-yrs)    Types: Cigarettes    Start date: 06/21/1987    Quit date: 01/21/2024    Years since quitting: 0.3   Smokeless tobacco: Never  Vaping Use   Vaping status: Never Used  Substance and Sexual Activity   Alcohol use: Yes    Comment: occasional   Drug use: No   Sexual activity: Yes    Partners: Male    Birth control/protection: Surgical    Comment: uterine ablation  Other Topics Concern   Not on file  Social History Narrative   Not on file   Social Drivers of Health   Financial Resource Strain: Low Risk  (01/21/2024)    Overall Financial Resource Strain (CARDIA)    Difficulty of Paying Living Expenses: Not hard at all  Food Insecurity: No Food Insecurity (01/21/2024)   Hunger Vital Sign    Worried About Running Out of Food in the Last Year: Never true    Ran Out of Food in the Last Year: Never true  Transportation Needs: No Transportation Needs (01/21/2024)   PRAPARE - Administrator, Civil Service (Medical): No    Lack of Transportation (Non-Medical): No  Physical Activity: Sufficiently Active (01/21/2024)   Exercise Vital Sign    Days of Exercise per Week: 3 days    Minutes of Exercise per Session: 60 min  Stress: No Stress Concern Present (01/21/2024)   [employer] of Occupational Health - Occupational Stress Questionnaire    Feeling of Stress: Only a little  Social Connections: Socially Integrated (01/21/2024)   Social Connection and Isolation Panel    Frequency of Communication with Friends and Family: More than three times a week    Frequency of Social Gatherings with Friends and Family: More than three times a week    Attends Religious Services: More than 4 times per year    Active Member of [employer] or Organizations: Yes    Attends Engineer, Structural: More than 4 times per year    Marital Status: Married    Family History  Problem Relation Age of Onset   Diabetes Mother    Colon cancer Mother    Ovarian cancer Mother    Hypertension Mother    Cancer Mother        Ovarian cancer   Diabetes Father    Hyperlipidemia Brother    Hypertension Brother    Hyperlipidemia Brother    Diabetes Paternal Aunt    Diabetes Paternal Uncle    Hypertension Maternal Grandfather    Colon cancer Maternal Grandfather 60   Diabetes Mellitus II Paternal Grandmother  Healthy Child        x 4   Esophageal cancer Neg Hx    Stomach cancer Neg Hx    Rectal cancer Neg Hx     Current Outpatient Medications on File Prior to Visit  Medication Sig Dispense Refill   buPROPion  ER  (WELLBUTRIN  SR) 100 MG 12 hr tablet 1tab daily x 1week, then increase to 1tab 2x/day, take with food 60 tablet 5   olmesartan  (BENICAR ) 40 MG tablet Take 1 tablet (40 mg total) by mouth daily. 90 tablet 1   No current facility-administered medications on file prior to visit.    No Known Allergies   Objective:   Vitals:   05/28/24 1517  BP: 130/73  Pulse: 87  Weight: 142 lb (64.4 kg)  Height: 5' 3 (1.6 m)   Physical Examination:   General appearance - well appearing, and in no distress  Mental status - alert, oriented to person, place, and time  Psych:  normal mood and affect  Skin - warm and dry, normal color, no suspicious lesions noted  Chest - effort normal, all lung fields clear to auscultation bilaterally  Heart - normal rate and regular rhythm  Neck:  midline trachea, no thyromegaly or nodules  Breasts - breasts appear normal, no suspicious masses, no skin or nipple changes or  axillary nodes  Abdomen - soft, nontender, nondistended, no masses or organomegaly  Pelvic -  VULVA: normal appearing vulva with no masses, tenderness or lesions   VAGINA: normal appearing vagina with normal color and discharge, no lesions   CERVIX: normal appearing cervix without discharge or lesions   UTERUS: uterus is felt to be normal size, shape, consistency and nontender   ADNEXA: No adnexal masses or tenderness noted.  Extremities:  No swelling or varicosities noted  Chaperone present for exam  Assessment and Plan:  1. Well female exam with routine gynecological exam (Primary) Pap up to date Mammo ordered Colonoscopy up to date - Lipid panel; Future  2. Encounter for screening mammogram for malignant neoplasm of breast - MM 3D SCREENING MAMMOGRAM BILATERAL BREAST; Future  3. Menopausal hot flushes Patient with significant menopausal symptoms impacting her quality of life. Her 10 year cardiovascular risk score is 6.7% based on her 2024 lipid panel. She is a good candidate for  menopausal hormone therapy given young age. Would recommend that we do her lipid panel to update her score given that she is a smoker.  If score remains low, can start on menopausal hormone therapy +/- vaginal estrogen.  We reviewed hormone therapy for treatment of symptoms as well as nonhormonal therapy though this can be less effective. We reviewed using the lowest effective dose for the shortest amount of time and reassessing on a yearly basis. Ideal candidates are under the age of 31 and within 10 years of menopause. We reviewed low risks of VTE or breast cancer with prolonged use.   - lipid panel - f/u for virtual visit  4. Vaginal dryness Consider vaginal estrogen  No follow-ups on file.  Future Appointments  Date Time Provider Department Center  05/28/2024  3:50 PM Kipling Graser, Rollo DASEN, MD [facility] None    Rollo DASEN Bring, MD, FACOG Obstetrician & Gynecologist, [facility], [facility]

## 2024-06-03 ENCOUNTER — Other Ambulatory Visit

## 2024-06-03 DIAGNOSIS — Z01419 Encounter for gynecological examination (general) (routine) without abnormal findings: Secondary | ICD-10-CM

## 2024-06-03 NOTE — Progress Notes (Signed)
 Pt presented for a lipid panel labs. Pt sent to lab to obtain labs.  Shawnee Fleet, CMA

## 2024-06-04 ENCOUNTER — Ambulatory Visit: Payer: Self-pay | Admitting: Obstetrics and Gynecology

## 2024-06-04 LAB — LIPID PANEL
Chol/HDL Ratio: 2.5 ratio (ref 0.0–4.4)
Cholesterol, Total: 212 mg/dL — ABNORMAL HIGH (ref 100–199)
HDL: 85 mg/dL (ref >39)
LDL Chol Calc (NIH): 113 mg/dL — ABNORMAL HIGH (ref 0–99)
Triglycerides: 81 mg/dL (ref 0–149)
VLDL Cholesterol Cal: 14 mg/dL (ref 5–40)

## 2024-06-15 ENCOUNTER — Other Ambulatory Visit: Payer: Self-pay | Admitting: Nurse Practitioner

## 2024-06-15 DIAGNOSIS — I10 Essential (primary) hypertension: Secondary | ICD-10-CM

## 2024-06-18 NOTE — Telephone Encounter (Signed)
 Requesting: OLMESARTAN  MEDOXOMIL 40 MG TAB  Last Visit: 01/22/2024 Next Visit: Visit date not found Last Refill: 11/30/2023  Please Advise

## 2024-07-02 ENCOUNTER — Telehealth: Admitting: Obstetrics and Gynecology
